# Patient Record
Sex: Male | Born: 1970 | Race: White | Hispanic: No | Marital: Married | Smoking: Former smoker
Health system: Southern US, Community
[De-identification: ages and names within clinical notes are randomized; demographics above are authoritative.]

## PROBLEM LIST (undated history)

## (undated) DIAGNOSIS — F329 Major depressive disorder, single episode, unspecified: Secondary | ICD-10-CM

## (undated) DIAGNOSIS — D759 Disease of blood and blood-forming organs, unspecified: Secondary | ICD-10-CM

## (undated) DIAGNOSIS — F32A Depression, unspecified: Secondary | ICD-10-CM

## (undated) DIAGNOSIS — G4730 Sleep apnea, unspecified: Secondary | ICD-10-CM

## (undated) DIAGNOSIS — Z8719 Personal history of other diseases of the digestive system: Secondary | ICD-10-CM

## (undated) DIAGNOSIS — K219 Gastro-esophageal reflux disease without esophagitis: Secondary | ICD-10-CM

## (undated) DIAGNOSIS — IMO0002 Reserved for concepts with insufficient information to code with codable children: Secondary | ICD-10-CM

## (undated) HISTORY — DX: Depression, unspecified: F32.A

## (undated) HISTORY — PX: FRACTURE SURGERY: SHX138

## (undated) HISTORY — PX: HERNIA REPAIR: SHX51

## (undated) HISTORY — PX: APPENDECTOMY: SHX54

## (undated) HISTORY — DX: Major depressive disorder, single episode, unspecified: F32.9

---

## 2002-01-27 HISTORY — PX: BACK SURGERY: SHX140

## 2006-01-16 ENCOUNTER — Emergency Department (HOSPITAL_COMMUNITY): Admission: EM | Admit: 2006-01-16 | Discharge: 2006-01-16 | Payer: Self-pay | Admitting: Emergency Medicine

## 2006-04-07 ENCOUNTER — Ambulatory Visit: Payer: Self-pay | Admitting: *Deleted

## 2006-04-07 ENCOUNTER — Inpatient Hospital Stay (HOSPITAL_COMMUNITY): Admission: EM | Admit: 2006-04-07 | Discharge: 2006-04-09 | Payer: Self-pay | Admitting: *Deleted

## 2007-05-22 ENCOUNTER — Emergency Department (HOSPITAL_COMMUNITY): Admission: EM | Admit: 2007-05-22 | Discharge: 2007-05-22 | Payer: Self-pay | Admitting: Emergency Medicine

## 2007-05-23 ENCOUNTER — Emergency Department (HOSPITAL_COMMUNITY): Admission: EM | Admit: 2007-05-23 | Discharge: 2007-05-23 | Payer: Self-pay | Admitting: Emergency Medicine
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
## 2011-04-07 LAB — DIFFERENTIAL
Basophils Absolute: 0
Basophils Absolute: 0
Eosinophils Relative: 0
Lymphocytes Relative: 16
Lymphs Abs: 1.2
Monocytes Absolute: 0.7
Monocytes Relative: 6
Monocytes Relative: 7
Neutro Abs: 10.1 — ABNORMAL HIGH
Neutro Abs: 10.4 — ABNORMAL HIGH
Neutrophils Relative %: 84 — ABNORMAL HIGH

## 2011-04-07 LAB — BASIC METABOLIC PANEL
BUN: 12
CO2: 27
Calcium: 9.6
Chloride: 106
Creatinine, Ser: 1.09
GFR calc Af Amer: >60
GFR calc non Af Amer: >60

## 2011-04-07 LAB — URINALYSIS, ROUTINE W REFLEX MICROSCOPIC
Bilirubin Urine: NEGATIVE
Glucose, UA: NEGATIVE
Ketones, ur: NEGATIVE
Nitrite: NEGATIVE
Protein, ur: NEGATIVE
Specific Gravity, Urine: 1.025
Urobilinogen, UA: 0.2
pH: 6

## 2011-04-07 LAB — CBC
Platelets: 211
RDW: 12.5

## 2011-04-07 LAB — RAPID URINE DRUG SCREEN, HOSP PERFORMED
Barbiturates: NOT DETECTED
Benzodiazepines: POSITIVE — AB
Cocaine: NOT DETECTED
Opiates: NOT DETECTED

## 2011-04-07 LAB — ETHANOL: Alcohol, Ethyl (B): <5

## 2011-08-23 ENCOUNTER — Encounter (HOSPITAL_BASED_OUTPATIENT_CLINIC_OR_DEPARTMENT_OTHER): Payer: Self-pay | Admitting: Emergency Medicine

## 2011-08-23 ENCOUNTER — Emergency Department (HOSPITAL_BASED_OUTPATIENT_CLINIC_OR_DEPARTMENT_OTHER)
Admission: EM | Admit: 2011-08-23 | Discharge: 2011-08-23 | Disposition: A | Discharge status: Home / Self Care | Payer: Self-pay | Attending: Emergency Medicine | Admitting: Emergency Medicine

## 2011-08-23 ENCOUNTER — Emergency Department (INDEPENDENT_AMBULATORY_CARE_PROVIDER_SITE_OTHER): Payer: Self-pay

## 2011-08-23 ENCOUNTER — Ambulatory Visit (HOSPITAL_BASED_OUTPATIENT_CLINIC_OR_DEPARTMENT_OTHER)
Admission: RE | Admit: 2011-08-23 | Discharge: 2011-08-23 | Disposition: A | Discharge status: Home / Self Care | Payer: Self-pay | Source: Ambulatory Visit | Attending: Nurse Practitioner | Admitting: Nurse Practitioner

## 2011-08-23 DIAGNOSIS — M62838 Other muscle spasm: Secondary | ICD-10-CM | POA: Insufficient documentation

## 2011-08-23 DIAGNOSIS — M5030 Other cervical disc degeneration, unspecified cervical region: Secondary | ICD-10-CM

## 2011-08-23 DIAGNOSIS — IMO0002 Reserved for concepts with insufficient information to code with codable children: Secondary | ICD-10-CM

## 2011-08-23 DIAGNOSIS — M542 Cervicalgia: Secondary | ICD-10-CM | POA: Insufficient documentation

## 2011-08-23 DIAGNOSIS — M5020 Other cervical disc displacement, unspecified cervical region: Secondary | ICD-10-CM

## 2011-08-23 DIAGNOSIS — M47812 Spondylosis without myelopathy or radiculopathy, cervical region: Secondary | ICD-10-CM

## 2011-08-23 HISTORY — DX: Reserved for concepts with insufficient information to code with codable children: IMO0002

## 2011-08-23 MED ORDER — HYDROMORPHONE HCL PF 1 MG/ML IJ SOLN
1.0000 mg | Freq: Once | INTRAMUSCULAR | Status: AC
  Administered 2011-08-23: 1 mg via INTRAMUSCULAR
  Filled 2011-08-23: qty 1

## 2011-08-23 MED ORDER — DIAZEPAM 10 MG PO TABS: 10.0000 mg | ORAL_TABLET | Freq: Two times a day (BID) | ORAL | Status: AC

## 2011-08-23 MED ORDER — CYCLOBENZAPRINE HCL 10 MG PO TABS
10.0000 mg | ORAL_TABLET | Freq: Once | ORAL | Status: AC
  Administered 2011-08-23: 10 mg via ORAL
  Filled 2011-08-23: qty 1

## 2011-08-23 MED ORDER — OXYCODONE-ACETAMINOPHEN 5-325 MG PO TABS: 2.0000 | ORAL_TABLET | ORAL | Status: AC | PRN

## 2011-08-23 NOTE — ED Notes (Signed)
Pt sts he has had a "crick" in his neck for several days, today pain became severe when he was getting up off sofa

## 2011-08-23 NOTE — ED Provider Notes (Signed)
Medical screening examination/treatment/procedure(s) were performed by non-physician practitioner and as supervising physician I was immediately available for consultation/collaboration.  Odysseus Cada, MD 08/23/11 2227 

## 2011-08-23 NOTE — ED Provider Notes (Signed)
History     CSN: [id number]  Arrival date & time 08/23/11  1610   First MD Initiated Contact with Patient 08/23/11 1657      Chief Complaint  Patient presents with  . Neck Pain    (Consider location/radiation/quality/duration/timing/severity/associated sxs/prior treatment) HPI Comments: Pt states that he woke up with the soreness a couple of day ago and then today the pain seem to worsen getting off the couch:pt denies any known injury  Patient is a 41 y.o. male presenting with neck pain. The history is provided by the patient. No language interpreter was used.  Neck Pain  This is a recurrent problem. The current episode started more than 2 days ago. The problem occurs constantly. The problem has been rapidly worsening. The pain is associated with twisting. There has been no fever. The pain is present in the left side. The quality of the pain is described as stabbing. The symptoms are aggravated by twisting. The pain is the same all the time. Pertinent negatives include no numbness, no bowel incontinence, no bladder incontinence and no weakness. He has tried NSAIDs and heat for the symptoms. The treatment provided mild relief.    Past Medical History  Diagnosis Date  . DDD (degenerative disc disease)     Past Surgical History  Procedure Date  . Back surgery     History reviewed. No pertinent family history.  History  Substance Use Topics  . Smoking status: Former Games developer  . Smokeless tobacco: Not on file  . Alcohol Use: Yes     social      Review of Systems  HENT: Positive for neck pain.   Gastrointestinal: Negative for bowel incontinence.  Genitourinary: Negative for bladder incontinence.  Neurological: Negative for weakness and numbness.  All other systems reviewed and are negative.    Allergies  Review of patient's allergies indicates no known allergies.  Home Medications   Current Outpatient Rx  Name Route Sig Dispense Refill  . AMOXICILLIN 875 MG PO  TABS Oral Take 875 mg by mouth 2 (two) times daily. For 10 days starting 08/08/2011    . GOODY HEADACHE PO Oral Take 1 packet by mouth once as needed. For headache    . IBUPROFEN 200 MG PO TABS Oral Take 600 mg by mouth every 6 (six) hours as needed. For pain    . LIDOCAINE VISCOUS 2 % MT SOLN Mouth Rinse 5-10 mLs by Mouth Rinse route every 3 (three) hours as needed. For sore throat    . PROPOXYPHENE N-ACETAMINOPHEN 100-650 MG PO TABS Oral Take 1 tablet by mouth once as needed. For pain    . RANITIDINE HCL 150 MG PO TABS Oral Take 150 mg by mouth daily.      BP 159/110  Pulse 87  Temp(Src) 98 F (36.7 C) (Oral)  Resp 20  Ht 6\' 3"  (1.905 m)  Wt 245 lb (111.131 kg)  BMI 30.62 kg/m2  SpO2 97%  Physical Exam  Nursing note and vitals reviewed. Constitutional: He is oriented to person, place, and time. He appears well-developed and well-nourished.  HENT:  Head: Normocephalic and atraumatic.  Eyes: Conjunctivae and EOM are normal.  Neck: Neck supple.       Pt tender in the left lateral neck:pt unable to move side to side due to pain  Pulmonary/Chest: Effort normal.  Abdominal: Soft. Bowel sounds are normal.  Musculoskeletal:       Pt tender with mild swelling noted in the left lateral neck  Neurological:  He is alert and oriented to person, place, and time. He exhibits normal muscle tone. Coordination normal.       Pt has equal grip strengths  Skin: Skin is warm and dry.  Psychiatric: He has a normal mood and affect.    ED Course  Procedures (including critical care time)  Labs Reviewed - No data to display Ct Cervical Spine Wo Contrast  08/23/2011  *RADIOLOGY REPORT*  Clinical Data: 41 year old male with neck pain.  CT CERVICAL SPINE WITHOUT CONTRAST  Technique:  Multidetector CT imaging of the cervical spine was performed. Multiplanar CT image reconstructions were also generated.  Comparison: 05/23/2007  Findings: Reversal of normal cervical lordosis is identified. There is no  evidence of fracture, subluxation or prevertebral soft tissue swelling.  Mild degenerative disc disease and spondylosis at C5-C6 again noted with broad-based disc bulge which contacts the cord and causes mild to moderate central spinal narrowing.  There is no evidence of bony foraminal narrowing bilaterally. No suspicious bony lesions are identified. The soft tissue structures are unremarkable otherwise.  IMPRESSION: No evidence of acute bony abnormality.  Degenerative disease and broad-based disc bulge at C5-C6 again noted.  The disc bulge contacts the cord and causes mild to moderate central spinal narrowing - correlate with the patient's symptoms.  Original Report Authenticated By: Rosendo Gros, M.D.     1. Muscle spasm   2. Bulging disc       MDM  Pt  Is feeling slightly better with pain medication:pt not having any neuro deficits:pt to have outpt imaging        Teressa Lower, NP 08/23/11 1903

## 2011-08-23 NOTE — ED Notes (Signed)
Patient transported to CT 

## 2011-08-23 NOTE — ED Notes (Signed)
Pt stated that he was still in severe pain, pt states that he has not observed any change by using pain medication, discussed with Deliah Boston, FNP. No orders received.

## 2011-08-24 ENCOUNTER — Other Ambulatory Visit (HOSPITAL_BASED_OUTPATIENT_CLINIC_OR_DEPARTMENT_OTHER): Payer: Self-pay | Admitting: Emergency Medicine

## 2011-08-24 DIAGNOSIS — IMO0002 Reserved for concepts with insufficient information to code with codable children: Secondary | ICD-10-CM

## 2011-08-25 ENCOUNTER — Ambulatory Visit (HOSPITAL_BASED_OUTPATIENT_CLINIC_OR_DEPARTMENT_OTHER)
Admission: RE | Admit: 2011-08-25 | Discharge: 2011-08-25 | Disposition: A | Discharge status: Home / Self Care | Payer: Medicare Other | Source: Ambulatory Visit | Attending: Emergency Medicine | Admitting: Emergency Medicine

## 2011-08-25 DIAGNOSIS — M5090 Cervical disc disorder, unspecified, unspecified cervical region: Secondary | ICD-10-CM | POA: Insufficient documentation

## 2011-08-25 DIAGNOSIS — M5020 Other cervical disc displacement, unspecified cervical region: Secondary | ICD-10-CM

## 2011-08-25 DIAGNOSIS — R209 Unspecified disturbances of skin sensation: Secondary | ICD-10-CM

## 2011-08-25 DIAGNOSIS — M542 Cervicalgia: Secondary | ICD-10-CM

## 2012-06-03 ENCOUNTER — Other Ambulatory Visit: Payer: Self-pay | Admitting: Neurosurgery

## 2012-06-03 DIAGNOSIS — M5410 Radiculopathy, site unspecified: Secondary | ICD-10-CM

## 2012-06-03 DIAGNOSIS — M542 Cervicalgia: Secondary | ICD-10-CM

## 2012-06-09 ENCOUNTER — Ambulatory Visit (HOSPITAL_BASED_OUTPATIENT_CLINIC_OR_DEPARTMENT_OTHER): Payer: Medicare Other | Attending: Physician Assistant

## 2012-06-09 VITALS — Ht 73.0 in | Wt 250.0 lb

## 2012-06-09 DIAGNOSIS — G4733 Obstructive sleep apnea (adult) (pediatric): Secondary | ICD-10-CM | POA: Insufficient documentation

## 2012-06-10 ENCOUNTER — Ambulatory Visit
Admission: RE | Admit: 2012-06-10 | Discharge: 2012-06-10 | Disposition: A | Discharge status: Home / Self Care | Payer: Medicare Other | Source: Ambulatory Visit | Attending: Neurosurgery | Admitting: Neurosurgery

## 2012-06-10 VITALS — BP 112/65 | HR 55

## 2012-06-10 DIAGNOSIS — M5410 Radiculopathy, site unspecified: Secondary | ICD-10-CM

## 2012-06-10 DIAGNOSIS — M542 Cervicalgia: Secondary | ICD-10-CM

## 2012-06-10 MED ORDER — DIAZEPAM 5 MG PO TABS
10.0000 mg | ORAL_TABLET | Freq: Once | ORAL | Status: AC
  Administered 2012-06-10: 10 mg via ORAL

## 2012-06-10 MED ORDER — HYDROCODONE-ACETAMINOPHEN 5-325 MG PO TABS
2.0000 | ORAL_TABLET | Freq: Once | ORAL | Status: AC
  Administered 2012-06-10: 2 via ORAL

## 2012-06-10 MED ORDER — IOHEXOL 300 MG/ML  SOLN
10.0000 mL | Freq: Once | INTRAMUSCULAR | Status: AC | PRN
  Administered 2012-06-10: 10 mL via INTRATHECAL

## 2012-06-11 DIAGNOSIS — R0609 Other forms of dyspnea: Secondary | ICD-10-CM

## 2012-06-11 DIAGNOSIS — R0989 Other specified symptoms and signs involving the circulatory and respiratory systems: Secondary | ICD-10-CM

## 2012-06-11 DIAGNOSIS — G4733 Obstructive sleep apnea (adult) (pediatric): Secondary | ICD-10-CM

## 2012-06-11 NOTE — Procedures (Signed)
NAME:  [name], Tony [name] NO.:  [id number]  MEDICAL RECORD NO.:  [id number]          PATIENT TYPE:  OUT  LOCATION:  SLEEP CENTER                 FACILITY[facility]  [facility]  PHYSICIAN:  Courtney Fenlon D. Maple Hudson, MD, FCCP, FACPDATE OF BIRTH:  1970/09/12  DATE OF STUDY:  06/09/2012                           NOCTURNAL POLYSOMNOGRAM  REFERRING PHYSICIAN:  Helene Kelp, PA-C  INDICATION FOR STUDY:  Hypersomnia with sleep apnea.  EPWORTH SLEEPINESS SCORE:  1/24.  BMI 33, weight 250 pounds, height 71 inches, neck 17 inches.  MEDICATIONS:  Home medications are charted and reviewed.  SLEEP ARCHITECTURE:  Split study protocol.  During the diagnostic phase, total sleep time 128 minutes with sleep efficiency 73.8%.  Stage I was 5.5%, stage II, 84%, stage III absent, REM 10.5% of total sleep time. Sleep latency 12 minutes, REM latency 146.5 minutes, awake after sleep onset 33.5 minutes.  Arousal index 3.3.  BEDTIME MEDICATION:  None.  He had a sustained interval of wakefulness between approximately 2 a.m. and 5 a.m.  RESPIRATORY DATA:  Split study protocol.  Apnea/hypopnea index (AHI) 28.6 per hour.  A total of 63 events was scored including 12 obstructive apneas, 1 central apnea, 48 hypopneas.  Most events were associated with supine sleep position.  REM AHI 97.8 per hour.  CPAP was then titrated to 11 CWP, AHI 0 per hour.  He wore a medium ResMed Quattro FX full-face mask with heated humidifier.  OXYGEN DATA:  Snoring before CPAP with oxygen desaturation to a nadir of 78% on room air.  With CPAP control, snoring was prevented and mean oxygen saturation held 93.1% on room air.  CARDIAC DATA:  Normal sinus rhythm.  MOVEMENT-PARASOMNIA:  No significant movement disturbance.  No bathroom trips.  IMPRESSIONS-RECOMMENDATIONS: 1. Moderate obstructive sleep apnea/hypopnea syndrome, AHI 28.6 per     hour with mostly supine events.  Snoring with oxygen desaturation     to a nadir of 78% on  room air. 2. Successful CPAP titration to 11 CWP, AHI 0 per hour.  He wore a     medium ResMed Quattro FX full-face     mask with heated humidifier.  Snoring was prevented and mean oxygen     saturation held 93.1% on room air with CPAP.     Alphonsine Minium D. Maple Hudson, MD, [facility], FACP Diplomate, American Board of Sleep Medicine    CDY/MEDQ  D:  06/11/2012 13:59:14  T:  06/11/2012 16:10:96  Job:  [id number]

## 2012-06-23 ENCOUNTER — Other Ambulatory Visit: Payer: Self-pay | Admitting: Neurosurgery

## 2012-06-24 ENCOUNTER — Encounter (HOSPITAL_COMMUNITY): Payer: Self-pay | Admitting: Pharmacy Technician

## 2012-06-27 NOTE — Pre-Procedure Instructions (Signed)
[address] Demarlo Riojas [address]under  06/27/2012   Your procedure is scheduled on:  January 7  Report to [facility] at 12:15 PM.  Call this number if you have problems the morning of surgery: [phone]   Remember:   Do not eat or drink:After Midnight.  Take these medicines the morning of surgery with A SIP OF WATER: Neurontin, Hydrocodone, Prilosec, Bring CPAP mask with you   Do not wear jewelry, make-up or nail polish.  Do not wear lotions, powders, or perfumes. You may wear deodorant.  Do not shave 48 hours prior to surgery. Men may shave face and neck.  Do not bring valuables to the hospital.  Contacts, dentures or bridgework may not be worn into surgery.  Leave suitcase in the car. After surgery it may be brought to your room.  For patients admitted to the hospital, checkout time is 11:00 AM the day of discharge.   Patients discharged the day of surgery will not be allowed to drive home.  Name and phone number of your driver: Family/ Friend  Special Instructions: Shower using CHG 2 nights before surgery and the night before surgery.  If you shower the day of surgery use CHG.  Use special wash - you have one bottle of CHG for all showers.  You should use approximately 1/3 of the bottle for each shower.   Please read over the following fact sheets that you were given: Pain Booklet, Coughing and Deep Breathing and Surgical Site Infection Prevention

## 2012-06-28 ENCOUNTER — Encounter (HOSPITAL_COMMUNITY): Payer: Self-pay

## 2012-06-28 ENCOUNTER — Encounter (HOSPITAL_COMMUNITY)
Admission: RE | Admit: 2012-06-28 | Discharge: 2012-06-28 | Disposition: A | Discharge status: Home / Self Care | Payer: Medicare Other | Source: Ambulatory Visit | Attending: Neurosurgery | Admitting: Neurosurgery

## 2012-06-28 HISTORY — DX: Personal history of other diseases of the digestive system: Z87.19

## 2012-06-28 HISTORY — DX: Sleep apnea, unspecified: G47.30

## 2012-06-28 HISTORY — DX: Gastro-esophageal reflux disease without esophagitis: K21.9

## 2012-06-28 LAB — CBC
MCH: 29.9 pg (ref 26.0–34.0)
MCHC: 33.8 g/dL (ref 30.0–36.0)
MCV: 88.4 fL (ref 78.0–100.0)
Platelets: 230 10*3/uL (ref 150–400)
RBC: 5.19 MIL/uL (ref 4.22–5.81)

## 2012-06-28 LAB — BASIC METABOLIC PANEL
BUN: 14 mg/dL (ref 6–23)
Calcium: 9.2 mg/dL (ref 8.4–10.5)
Creatinine, Ser: 1.12 mg/dL (ref 0.50–1.35)
GFR calc Af Amer: >90 mL/min (ref >90)

## 2012-06-28 LAB — SURGICAL PCR SCREEN
MRSA, PCR: NEGATIVE
Staphylococcus aureus: NEGATIVE

## 2012-07-04 MED ORDER — CEFAZOLIN SODIUM-DEXTROSE 2-3 GM-% IV SOLR
2.0000 g | INTRAVENOUS | Status: AC
  Administered 2012-07-05: 2 g via INTRAVENOUS
  Filled 2012-07-04: qty 50

## 2012-07-05 ENCOUNTER — Encounter (HOSPITAL_COMMUNITY): Payer: Self-pay | Admitting: *Deleted

## 2012-07-05 ENCOUNTER — Inpatient Hospital Stay (HOSPITAL_COMMUNITY)
Admission: RE | Admit: 2012-07-05 | Discharge: 2012-07-07 | DRG: 473 | Disposition: A | Discharge status: Home / Self Care | Payer: Medicare Other | Source: Ambulatory Visit | Attending: Neurosurgery | Admitting: Neurosurgery

## 2012-07-05 ENCOUNTER — Ambulatory Visit (HOSPITAL_COMMUNITY): Payer: Medicare Other

## 2012-07-05 ENCOUNTER — Encounter (HOSPITAL_COMMUNITY): Payer: Self-pay | Admitting: Anesthesiology

## 2012-07-05 ENCOUNTER — Encounter (HOSPITAL_COMMUNITY)
Admission: RE | Disposition: A | Discharge status: Home / Self Care | Payer: Self-pay | Source: Ambulatory Visit | Attending: Neurosurgery

## 2012-07-05 ENCOUNTER — Ambulatory Visit (HOSPITAL_COMMUNITY): Payer: Medicare Other | Admitting: Anesthesiology

## 2012-07-05 DIAGNOSIS — M4802 Spinal stenosis, cervical region: Principal | ICD-10-CM | POA: Diagnosis present

## 2012-07-05 HISTORY — DX: Disease of blood and blood-forming organs, unspecified: D75.9

## 2012-07-05 HISTORY — PX: ANTERIOR CERVICAL DECOMP/DISCECTOMY FUSION: SHX1161

## 2012-07-05 SURGERY — ANTERIOR CERVICAL DECOMPRESSION/DISCECTOMY FUSION 2 LEVELS
Anesthesia: General | Site: Neck | Wound class: Clean

## 2012-07-05 MED ORDER — DIAZEPAM 5 MG PO TABS
5.0000 mg | ORAL_TABLET | Freq: Four times a day (QID) | ORAL | Status: DC | PRN
  Administered 2012-07-06 (×2): 5 mg via ORAL
  Filled 2012-07-05 (×2): qty 1

## 2012-07-05 MED ORDER — VECURONIUM BROMIDE 10 MG IV SOLR
INTRAVENOUS | Status: DC | PRN
  Administered 2012-07-05: 3 mg via INTRAVENOUS

## 2012-07-05 MED ORDER — DEXAMETHASONE SODIUM PHOSPHATE 4 MG/ML IJ SOLN
4.0000 mg | Freq: Four times a day (QID) | INTRAMUSCULAR | Status: DC
  Filled 2012-07-05 (×8): qty 1

## 2012-07-05 MED ORDER — GLYCOPYRROLATE 0.2 MG/ML IJ SOLN
INTRAMUSCULAR | Status: DC | PRN
  Administered 2012-07-05: 0.4 mg via INTRAVENOUS

## 2012-07-05 MED ORDER — SODIUM CHLORIDE 0.9 % IJ SOLN
3.0000 mL | Freq: Two times a day (BID) | INTRAMUSCULAR | Status: DC
  Administered 2012-07-07: 3 mL via INTRAVENOUS

## 2012-07-05 MED ORDER — HYDROMORPHONE HCL PF 1 MG/ML IJ SOLN
INTRAMUSCULAR | Status: AC
  Filled 2012-07-05: qty 1

## 2012-07-05 MED ORDER — SODIUM CHLORIDE 0.9 % IV SOLN
INTRAVENOUS | Status: DC
  Administered 2012-07-05 – 2012-07-06 (×2): via INTRAVENOUS

## 2012-07-05 MED ORDER — MORPHINE SULFATE 2 MG/ML IJ SOLN
1.0000 mg | INTRAMUSCULAR | Status: DC | PRN
  Administered 2012-07-05: 2 mg via INTRAVENOUS

## 2012-07-05 MED ORDER — CEFAZOLIN SODIUM 1-5 GM-% IV SOLN
1.0000 g | Freq: Three times a day (TID) | INTRAVENOUS | Status: AC
  Administered 2012-07-05 – 2012-07-06 (×2): 1 g via INTRAVENOUS
  Filled 2012-07-05 (×2): qty 50

## 2012-07-05 MED ORDER — SODIUM CHLORIDE 0.9 % IV SOLN: 250.0000 mL | INTRAVENOUS | Status: DC

## 2012-07-05 MED ORDER — ROCURONIUM BROMIDE 100 MG/10ML IV SOLN
INTRAVENOUS | Status: DC | PRN
  Administered 2012-07-05: 50 mg via INTRAVENOUS

## 2012-07-05 MED ORDER — ONDANSETRON HCL 4 MG/2ML IJ SOLN: 4.0000 mg | INTRAMUSCULAR | Status: DC | PRN

## 2012-07-05 MED ORDER — MORPHINE SULFATE (PF) 1 MG/ML IV SOLN
INTRAVENOUS | Status: DC
  Administered 2012-07-05: 25 mL via INTRAVENOUS
  Administered 2012-07-06: 1.5 mg via INTRAVENOUS
  Administered 2012-07-06: 25 mg via INTRAVENOUS
  Administered 2012-07-06: 19.5 mg via INTRAVENOUS
  Administered 2012-07-07: 01:00:00 via INTRAVENOUS
  Administered 2012-07-07: 21 mg via INTRAVENOUS
  Filled 2012-07-05 (×3): qty 25

## 2012-07-05 MED ORDER — ACETAMINOPHEN 650 MG RE SUPP: 650.0000 mg | RECTAL | Status: DC | PRN

## 2012-07-05 MED ORDER — NALOXONE HCL 0.4 MG/ML IJ SOLN: 0.4000 mg | INTRAMUSCULAR | Status: DC | PRN

## 2012-07-05 MED ORDER — MIDAZOLAM HCL 5 MG/5ML IJ SOLN
INTRAMUSCULAR | Status: DC | PRN
  Administered 2012-07-05: 2 mg via INTRAVENOUS

## 2012-07-05 MED ORDER — SODIUM CHLORIDE 0.9 % IJ SOLN: 9.0000 mL | INTRAMUSCULAR | Status: DC | PRN

## 2012-07-05 MED ORDER — MENTHOL 3 MG MT LOZG
1.0000 | LOZENGE | OROMUCOSAL | Status: DC | PRN
  Filled 2012-07-05: qty 9

## 2012-07-05 MED ORDER — DIAZEPAM 5 MG PO TABS
ORAL_TABLET | ORAL | Status: AC
  Filled 2012-07-05: qty 1

## 2012-07-05 MED ORDER — SODIUM CHLORIDE 0.9 % IJ SOLN: 3.0000 mL | INTRAMUSCULAR | Status: DC | PRN

## 2012-07-05 MED ORDER — 0.9 % SODIUM CHLORIDE (POUR BTL) OPTIME
TOPICAL | Status: DC | PRN
  Administered 2012-07-05: 1000 mL

## 2012-07-05 MED ORDER — NEOSTIGMINE METHYLSULFATE 1 MG/ML IJ SOLN
INTRAMUSCULAR | Status: DC | PRN
  Administered 2012-07-05: 3 mg via INTRAVENOUS

## 2012-07-05 MED ORDER — PHENOL 1.4 % MT LIQD
1.0000 | OROMUCOSAL | Status: DC | PRN
  Filled 2012-07-05: qty 177

## 2012-07-05 MED ORDER — DIPHENHYDRAMINE HCL 50 MG/ML IJ SOLN: 12.5000 mg | Freq: Four times a day (QID) | INTRAMUSCULAR | Status: DC | PRN

## 2012-07-05 MED ORDER — DEXAMETHASONE SODIUM PHOSPHATE 4 MG/ML IJ SOLN
INTRAMUSCULAR | Status: DC | PRN
  Administered 2012-07-05: 4 mg via INTRAVENOUS

## 2012-07-05 MED ORDER — DEXAMETHASONE 4 MG PO TABS
4.0000 mg | ORAL_TABLET | Freq: Four times a day (QID) | ORAL | Status: DC
  Administered 2012-07-05 – 2012-07-07 (×6): 4 mg via ORAL
  Filled 2012-07-05 (×10): qty 1

## 2012-07-05 MED ORDER — INFLUENZA VIRUS VACC SPLIT PF IM SUSP
0.5000 mL | INTRAMUSCULAR | Status: AC | PRN
  Administered 2012-07-07: 0.5 mL via INTRAMUSCULAR
  Filled 2012-07-05: qty 0.5

## 2012-07-05 MED ORDER — LIDOCAINE HCL (CARDIAC) 20 MG/ML IV SOLN
INTRAVENOUS | Status: DC | PRN
  Administered 2012-07-05: 100 mg via INTRAVENOUS

## 2012-07-05 MED ORDER — PROPOFOL 10 MG/ML IV BOLUS
INTRAVENOUS | Status: DC | PRN
  Administered 2012-07-05: 200 mg via INTRAVENOUS

## 2012-07-05 MED ORDER — MORPHINE SULFATE 2 MG/ML IJ SOLN
INTRAMUSCULAR | Status: AC
  Filled 2012-07-05: qty 1

## 2012-07-05 MED ORDER — METOCLOPRAMIDE HCL 5 MG/ML IJ SOLN: 10.0000 mg | Freq: Once | INTRAMUSCULAR | Status: DC | PRN

## 2012-07-05 MED ORDER — HEMOSTATIC AGENTS (NO CHARGE) OPTIME
TOPICAL | Status: DC | PRN
  Administered 2012-07-05 (×2): 1 via TOPICAL

## 2012-07-05 MED ORDER — GABAPENTIN 100 MG PO CAPS
100.0000 mg | ORAL_CAPSULE | Freq: Two times a day (BID) | ORAL | Status: DC
  Administered 2012-07-05: 200 mg via ORAL
  Administered 2012-07-06: 100 mg via ORAL
  Administered 2012-07-06 – 2012-07-07 (×2): 200 mg via ORAL
  Filled 2012-07-05 (×6): qty 2

## 2012-07-05 MED ORDER — OXYCODONE-ACETAMINOPHEN 5-325 MG PO TABS
ORAL_TABLET | ORAL | Status: AC
  Administered 2012-07-05: 2 via ORAL
  Filled 2012-07-05: qty 2

## 2012-07-05 MED ORDER — ONDANSETRON HCL 4 MG/2ML IJ SOLN
INTRAMUSCULAR | Status: DC | PRN
  Administered 2012-07-05: 4 mg via INTRAVENOUS

## 2012-07-05 MED ORDER — THROMBIN 5000 UNITS EX SOLR
CUTANEOUS | Status: DC | PRN
  Administered 2012-07-05 (×3): 5000 [IU] via TOPICAL

## 2012-07-05 MED ORDER — HYDROMORPHONE HCL PF 1 MG/ML IJ SOLN
0.2500 mg | INTRAMUSCULAR | Status: DC | PRN
  Administered 2012-07-05 (×4): 0.5 mg via INTRAVENOUS

## 2012-07-05 MED ORDER — ACETAMINOPHEN 325 MG PO TABS: 650.0000 mg | ORAL_TABLET | ORAL | Status: DC | PRN

## 2012-07-05 MED ORDER — ONDANSETRON HCL 4 MG/2ML IJ SOLN: 4.0000 mg | Freq: Four times a day (QID) | INTRAMUSCULAR | Status: DC | PRN

## 2012-07-05 MED ORDER — SUFENTANIL CITRATE 50 MCG/ML IV SOLN
INTRAVENOUS | Status: DC | PRN
  Administered 2012-07-05: 10 ug via INTRAVENOUS
  Administered 2012-07-05: 20 ug via INTRAVENOUS

## 2012-07-05 MED ORDER — PNEUMOCOCCAL VAC POLYVALENT 25 MCG/0.5ML IJ INJ
0.5000 mL | INJECTION | INTRAMUSCULAR | Status: AC | PRN
  Administered 2012-07-07: 0.5 mL via INTRAMUSCULAR
  Filled 2012-07-05: qty 0.5

## 2012-07-05 MED ORDER — OXYCODONE HCL 5 MG PO TABS: 5.0000 mg | ORAL_TABLET | Freq: Once | ORAL | Status: DC | PRN

## 2012-07-05 MED ORDER — ZOLPIDEM TARTRATE 5 MG PO TABS
10.0000 mg | ORAL_TABLET | Freq: Every evening | ORAL | Status: DC | PRN
  Administered 2012-07-06: 10 mg via ORAL
  Filled 2012-07-05: qty 2

## 2012-07-05 MED ORDER — LACTATED RINGERS IV SOLN
INTRAVENOUS | Status: DC | PRN
  Administered 2012-07-05 (×2): via INTRAVENOUS

## 2012-07-05 MED ORDER — DIPHENHYDRAMINE HCL 12.5 MG/5ML PO ELIX: 12.5000 mg | ORAL_SOLUTION | Freq: Four times a day (QID) | ORAL | Status: DC | PRN

## 2012-07-05 MED ORDER — OXYCODONE-ACETAMINOPHEN 5-325 MG PO TABS
1.0000 | ORAL_TABLET | ORAL | Status: DC | PRN
  Administered 2012-07-05 – 2012-07-07 (×4): 2 via ORAL
  Filled 2012-07-05 (×3): qty 2

## 2012-07-05 MED ORDER — OXYCODONE HCL 5 MG/5ML PO SOLN: 5.0000 mg | Freq: Once | ORAL | Status: DC | PRN

## 2012-07-05 SURGICAL SUPPLY — 55 items
APL SKNCLS STERI-STRIP NONHPOA (GAUZE/BANDAGES/DRESSINGS) ×1
BANDAGE GAUZE ELAST BULKY 4 IN (GAUZE/BANDAGES/DRESSINGS) ×4 IMPLANT
BENZOIN TINCTURE PRP APPL 2/3 (GAUZE/BANDAGES/DRESSINGS) ×2 IMPLANT
BIT DRILL SM SPINE QC 14 (BIT) ×1 IMPLANT
BLADE ULTRA TIP 2M (BLADE) ×2 IMPLANT
BUR BARREL STRAIGHT FLUTE 4.0 (BURR) IMPLANT
BUR MATCHSTICK NEURO 3.0 LAGG (BURR) ×2 IMPLANT
CANISTER SUCTION 2500CC (MISCELLANEOUS) ×2 IMPLANT
CLOTH BEACON ORANGE TIMEOUT ST (SAFETY) ×2 IMPLANT
CONT SPEC 4OZ CLIKSEAL STRL BL (MISCELLANEOUS) ×2 IMPLANT
COVER MAYO STAND STRL (DRAPES) ×2 IMPLANT
DRAIN JACKSON PRATT 10MM FLAT (MISCELLANEOUS) ×1 IMPLANT
DRAPE C-ARM 42X72 X-RAY (DRAPES) ×4 IMPLANT
DRAPE LAPAROTOMY 100X72 PEDS (DRAPES) ×2 IMPLANT
DRAPE MICROSCOPE LEICA (MISCELLANEOUS) ×2 IMPLANT
DRAPE POUCH INSTRU U-SHP 10X18 (DRAPES) ×2 IMPLANT
DURAPREP 6ML APPLICATOR 50/CS (WOUND CARE) ×2 IMPLANT
ELECT REM PT RETURN 9FT ADLT (ELECTROSURGICAL) ×2
ELECTRODE REM PT RTRN 9FT ADLT (ELECTROSURGICAL) ×1 IMPLANT
EVACUATOR SILICONE 100CC (DRAIN) ×1 IMPLANT
GAUZE SPONGE 4X4 16PLY XRAY LF (GAUZE/BANDAGES/DRESSINGS) IMPLANT
GLOVE BIOGEL M 8.0 STRL (GLOVE) ×2 IMPLANT
GLOVE BIOGEL PI IND STRL 7.5 (GLOVE) IMPLANT
GLOVE BIOGEL PI INDICATOR 7.5 (GLOVE) ×3
GLOVE ECLIPSE 7.5 STRL STRAW (GLOVE) ×4 IMPLANT
GLOVE EXAM NITRILE LRG STRL (GLOVE) IMPLANT
GLOVE EXAM NITRILE MD LF STRL (GLOVE) IMPLANT
GLOVE EXAM NITRILE XL STR (GLOVE) IMPLANT
GLOVE EXAM NITRILE XS STR PU (GLOVE) IMPLANT
GOWN BRE IMP SLV AUR LG STRL (GOWN DISPOSABLE) ×6 IMPLANT
GOWN BRE IMP SLV AUR XL STRL (GOWN DISPOSABLE) ×3 IMPLANT
GOWN STRL REIN 2XL LVL4 (GOWN DISPOSABLE) IMPLANT
HEAD HALTER (SOFTGOODS) ×2 IMPLANT
HEMOSTAT POWDER KIT SURGIFOAM (HEMOSTASIS) IMPLANT
KIT BASIN OR (CUSTOM PROCEDURE TRAY) ×2 IMPLANT
KIT ROOM TURNOVER OR (KITS) ×2 IMPLANT
NEEDLE SPNL 22GX3.5 QUINCKE BK (NEEDLE) ×2 IMPLANT
NS IRRIG 1000ML POUR BTL (IV SOLUTION) ×2 IMPLANT
PACK LAMINECTOMY NEURO (CUSTOM PROCEDURE TRAY) ×2 IMPLANT
PATTIES SURGICAL .5 X1 (DISPOSABLE) ×2 IMPLANT
PLATE ANT CERV XTEND 2 LV 36 (Plate) ×1 IMPLANT
PUTTY BONE GRAFT KIT 2.5ML (Bone Implant) ×2 IMPLANT
RUBBERBAND STERILE (MISCELLANEOUS) ×4 IMPLANT
SCREW XTD VAR 4.2 SELF TAP (Screw) ×6 IMPLANT
SPACER ACDF SM LORDOTIC 7 (Spacer) ×2 IMPLANT
SPONGE GAUZE 4X4 12PLY (GAUZE/BANDAGES/DRESSINGS) ×2 IMPLANT
SPONGE INTESTINAL PEANUT (DISPOSABLE) ×2 IMPLANT
SPONGE SURGIFOAM ABS GEL SZ50 (HEMOSTASIS) ×2 IMPLANT
STRIP CLOSURE SKIN 1/2X4 (GAUZE/BANDAGES/DRESSINGS) ×2 IMPLANT
SUT VIC AB 3-0 SH 8-18 (SUTURE) ×2 IMPLANT
SYR 20ML ECCENTRIC (SYRINGE) ×2 IMPLANT
TAPE CLOTH SURG 4X10 WHT LF (GAUZE/BANDAGES/DRESSINGS) ×1 IMPLANT
TOWEL OR 17X24 6PK STRL BLUE (TOWEL DISPOSABLE) ×2 IMPLANT
TOWEL OR 17X26 10 PK STRL BLUE (TOWEL DISPOSABLE) ×2 IMPLANT
WATER STERILE IRR 1000ML POUR (IV SOLUTION) ×2 IMPLANT

## 2012-07-05 NOTE — Progress Notes (Signed)
Orthopedic Tech Progress Note Patient Details:  Tony [name] 08-24-1970 [id number]  Ortho Devices Type of Ortho Device: Soft collar Ortho Device/Splint Location: neck Ortho Device/Splint Interventions: Application   Jennye Moccasin 07/05/2012, 8:37 PM

## 2012-07-05 NOTE — Transfer of Care (Signed)
Immediate Anesthesia Transfer of Care Note  Patient: Tony [name]  Procedure(s) Performed: Procedure(s) (LRB) with comments: ANTERIOR CERVICAL DECOMPRESSION/DISCECTOMY FUSION 2 LEVELS (N/A) - Anterior Cervical Four-Five/Five-Six Decompression/Diskectomy and Fusion   Patient Location: PACU  Anesthesia Type:General  Level of Consciousness: sedated, patient cooperative and responds to stimulation  Airway & Oxygen Therapy: Patient Spontanous Breathing and Patient connected to nasal cannula oxygen  Post-op Assessment: Report given to PACU RN, Post -op Vital signs reviewed and stable and Patient moving all extremities X 4  Post vital signs: Reviewed and stable  Complications: No apparent anesthesia complications

## 2012-07-05 NOTE — Anesthesia Postprocedure Evaluation (Signed)
  Anesthesia Post-op Note  Patient: Tony [name]  Procedure(s) Performed: Procedure(s) (LRB) with comments: ANTERIOR CERVICAL DECOMPRESSION/DISCECTOMY FUSION 2 LEVELS (N/A) - Anterior Cervical Four-Five/Five-Six Decompression/Diskectomy and Fusion   Patient Location: PACU  Anesthesia Type:General  Level of Consciousness: awake  Airway and Oxygen Therapy: Patient Spontanous Breathing  Post-op Pain: mild  Post-op Assessment: Post-op Vital signs reviewed  Post-op Vital Signs: stable  Complications: No apparent anesthesia complications

## 2012-07-05 NOTE — Anesthesia Preprocedure Evaluation (Signed)
Anesthesia Evaluation  Patient identified by MRN, date of birth, ID band Patient awake    Reviewed: Allergy & Precautions, H&P , NPO status , Patient's Chart, lab work & pertinent test results, reviewed documented beta blocker date and time   Airway Mallampati: II TM Distance: >3 FB Neck ROM: full    Dental   Pulmonary sleep apnea ,  breath sounds clear to auscultation        Cardiovascular negative cardio ROS  Rhythm:regular     Neuro/Psych negative neurological ROS  negative psych ROS   GI/Hepatic negative GI ROS, Neg liver ROS, hiatal hernia, GERD-  Medicated and Controlled,  Endo/Other  negative endocrine ROS  Renal/GU negative Renal ROS  negative genitourinary   Musculoskeletal   Abdominal   Peds  Hematology negative hematology ROS (+)   Anesthesia Other Findings See surgeon's H&P   Reproductive/Obstetrics negative OB ROS                           Anesthesia Physical Anesthesia Plan  ASA: II  Anesthesia Plan: General   Post-op Pain Management:    Induction: Intravenous  Airway Management Planned: Oral ETT and Video Laryngoscope Planned  Additional Equipment:   Intra-op Plan:   Post-operative Plan: Extubation in OR  Informed Consent: I have reviewed the patients History and Physical, chart, labs and discussed the procedure including the risks, benefits and alternatives for the proposed anesthesia with the patient or authorized representative who has indicated his/her understanding and acceptance.   Dental Advisory Given  Plan Discussed with: CRNA and Surgeon  Anesthesia Plan Comments:         Anesthesia Quick Evaluation

## 2012-07-05 NOTE — Progress Notes (Signed)
Op note [phone]
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
## 2012-07-06 ENCOUNTER — Encounter (HOSPITAL_COMMUNITY): Payer: Self-pay | Admitting: Neurosurgery

## 2012-07-06 NOTE — Evaluation (Signed)
Physical Therapy Evaluation Patient Details Name: Tony [name] MRN: [id number] DOB: 27-Oct-1970 Today's Date: 07/06/2012 Time: 4098-1191 PT Time Calculation (min): 30 min  PT Assessment / Plan / Recommendation Clinical Impression  Pt 42 yo male s/p cervical decompression/fusion mobilizing well. Anticipate patient to be safe for d/c home withspouse when medically ready.    PT Assessment  Patient needs continued PT services    Follow Up Recommendations  No PT follow up;Supervision/Assistance - 24 hour    Does the patient have the potential to tolerate intense rehabilitation      Barriers to Discharge None      Equipment Recommendations  Rolling walker with 5" wheels    Recommendations for Other Services     Frequency Min 5X/week    Precautions / Restrictions Precautions Precautions: Cervical;Fall Precaution Comments: cervical precautions reviewed, handout provided Required Braces or Orthoses: Cervical Brace Restrictions Weight Bearing Restrictions: No   Pain: 7/10 cervical      Mobility  Bed Mobility Bed Mobility: Rolling Right;Right Sidelying to Sit;Sitting - Scoot to Edge of Bed Rolling Right: 5: Supervision Right Sidelying to Sit: 5: Supervision Sitting - Scoot to Edge of Bed: 5: Supervision Details for Bed Mobility Assistance: instructed in log roll technique Transfers Transfers: Sit to Stand;Stand to Sit Sit to Stand: 5: Supervision;With upper extremity assist;From bed Stand to Sit: 5: Supervision;With armrests;To chair/3-in-1 Details for Transfer Assistance: verbal cues for technique and hand placement Ambulation/Gait Ambulation/Gait Assistance: 4: Min guard Ambulation Distance (Feet): 100 Feet Assistive device: None Ambulation/Gait Assistance Details: guarded, slow, no LOB Gait Pattern: Step-through pattern;Decreased stride length;Narrow base of support Gait velocity: slow and guarded General Gait Details: v/c's to relax shoulders Stairs: No      Shoulder Instructions     Exercises     PT Diagnosis: Difficulty walking  PT Problem List: Decreased balance;Decreased mobility PT Treatment Interventions: Gait training;Stair training;Functional mobility training;Therapeutic activities;Therapeutic exercise   PT Goals Acute Rehab PT Goals PT Goal Formulation: With patient Time For Goal Achievement: 07/13/12 Potential to Achieve Goals: Good Pt will Roll Supine to Right Side: with modified independence PT Goal: Rolling Supine to Right Side - Progress: Goal set today Pt will go Supine/Side to Sit: with modified independence;with HOB 0 degrees PT Goal: Supine/Side to Sit - Progress: Goal set today Pt will go Sit to Supine/Side: with modified independence;with HOB 0 degrees PT Goal: Sit to Supine/Side - Progress: Goal set today Pt will go Sit to Stand: with modified independence;with upper extremity assist PT Goal: Sit to Stand - Progress: Goal set today Pt will Ambulate: >150 feet;with modified independence;with least restrictive assistive device PT Goal: Ambulate - Progress: Goal set today Pt will Go Up / Down Stairs: 3-5 stairs;with min assist PT Goal: Up/Down Stairs - Progress: Goal set today Additional Goals Additional Goal #1: Pt independent with recall of 3/3 precautions and 100% compliant. PT Goal: Additional Goal #1 - Progress: Goal set today  Visit Information  Last PT Received On: 07/06/12 Assistance Needed: +1 PT/OT Co-Evaluation/Treatment: Yes    Subjective Data  Subjective: Pt received supine in bed   Prior Functioning  Home Living Lives With: Spouse Available Help at Discharge: Family;Available 24 hours/day Type of Home: House Home Access: Stairs to enter [employer] of Steps: 3 Entrance Stairs-Rails: None Home Layout: One level Bathroom Shower/Tub: Walk-in shower;Door Dentist: None Prior Function Level of Independence: Independent Able to Take  Stairs?: Yes Driving: Yes Vocation: On disability Communication Communication: No difficulties Dominant  Hand: Right    Cognition  Overall Cognitive Status: Appears within functional limits for tasks assessed/performed Arousal/Alertness: Awake/alert Orientation Level: Appears intact for tasks assessed Behavior During Session: [facility] for tasks performed    Extremity/Trunk Assessment Right Upper Extremity Assessment RUE ROM/Strength/Tone: [facility] for tasks assessed;Unable to fully assess;Due to precautions RUE Sensation:  (reports tingling has resolved) RUE Sensation Deficits: residual numbness but better than prior to surgery RUE Coordination: WFL - gross/fine motor Left Upper Extremity Assessment LUE ROM/Strength/Tone: WFL for tasks assessed;Unable to fully assess;Due to precautions LUE Sensation:  (reports improvement in numbness/tingling) LUE Coordination: WFL - gross/fine motor Right Lower Extremity Assessment RLE ROM/Strength/Tone: Within functional levels Left Lower Extremity Assessment LLE ROM/Strength/Tone: Within functional levels Trunk Assessment Trunk Assessment: Normal   Balance    End of Session PT - End of Session Equipment Utilized During Treatment: Gait belt;Cervical collar Activity Tolerance: Patient tolerated treatment well Patient left: in chair;with call bell/phone within reach;with family/visitor present Nurse Communication: Mobility status  GP     Marcene Brawn 07/06/2012, 1:12 PM  Lewis Shock, PT, DPT Pager #: [phone] Office #: [phone]

## 2012-07-06 NOTE — Op Note (Signed)
NAME:  WISE, FEES NO.:  [id number]  MEDICAL RECORD NO.:  [id number]  LOCATION:  [id number]                        FACILITY:  [facility]  PHYSICIAN:  Hilda Lias, M.D.   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  C4-C5 central herniated disk with stenosis.  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  C4-C5 central herniated disk with stenosis.  PROCEDURES:  Anterior 4-5 and 5-6 diskectomy, decompression of the spinal cord, foraminotomy, interbody fusion with cage and plate. Microscope.  SURGEON:  Hilda Lias, M.D.  ASSISTANT:  Clydene Fake, M.D.  CLINICAL HISTORY:  Mr. Tsosie is a gentleman who had been complaining of neck pain radiation to both upper extremities for many years.  This gentleman about 10 years ago had thoracolumbar fusion because of fracture.  He is complaining now of neck pain with weakness of the biceps and tingling in both hands.  He has nerve conduction test, which was negative.  MRI showed central disk at the level of 4-5 and has stenosis at the level of 5-6.  Surgery was advised.  The risks were fully explained to him including the benefit.  PROCEDURE:  The patient was taken to the OR, and after intubation, the left side of the neck was cleaned with DuraPrep.  Drapes were applied. Transverse incision was made through the skin, subcutaneous tissue, platysma, straight down to the cervical spine.  Lateral cervical spine x- rays showed that we were at the level of L5-6.  From then on, anterior osteophyte at the level of 5-6 and 4-5 were removed.  We started at the level of 5-6 with removal of degenerative disk.  The patient had quite a bit of degenerative disk.  The disk was removed using the drill as well as the microcurette.  We opened up posterior ligament and using the  1 and 2-mm Kerrison punch, we were able to decompress the spinal cord as well as both C6 nerve root.  At the level of 4-5, we found again the same degenerative disk disease.  The posterior ligament was opened and foraminotomy to decompress both C5 nerve root was accomplished.  From then on, the endplate were drilled.  Two cages of 7-mm height with autograft and bone extensor were inserted.  This was followed with plate using 6 screws.  Lateral cervical spine x-rays showed good position of the plate, screws and cages.  The area was irrigated.  A drain was left in the precervical area.  The wound was closed with Vicryl and Steri- Strips.  The patient will go to PACU.         ______________________________ Hilda Lias, M.D.    EB/MEDQ  D:  07/05/2012  T:  07/06/2012  Job:  [id number]

## 2012-07-06 NOTE — Progress Notes (Signed)
Patient ID: Tony [name], male   DOB: Dec 14, 1970, 42 y.o.   MRN: [id number] C/o pain in shoulders and some tingling in both hands, no weakness. Drain working well. I/o cath prn. Continue pca morphine

## 2012-07-06 NOTE — Evaluation (Signed)
Occupational Therapy Evaluation Patient Details Name: Tony [name] MRN: [id number] DOB: 27-Apr-1971 Today's Date: 07/06/2012 Time: 4098-1191 OT Time Calculation (min): 30 min  OT Assessment / Plan / Recommendation Clinical Impression  Pt is recovering from ACDF 4,5,6.  He has a long history of back problems and pain.  Pt is doing well post op day one.  Will follow to instruct further in cervical precautions and use of AE for ADL.    OT Assessment  Patient needs continued OT Services    Follow Up Recommendations  No OT follow up;Supervision - Intermittent    Barriers to Discharge      Equipment Recommendations  None recommended by OT    Recommendations for Other Services    Frequency  Min 2X/week    Precautions / Restrictions Precautions Precautions: Cervical;Fall Precaution Comments: cervical precautions reviewed, handout provided Required Braces or Orthoses: Cervical Brace Cervical Brace: Soft collar Restrictions Weight Bearing Restrictions: No   Pertinent Vitals/Pain 4/10 neck, has PCA, repositioned    ADL  Eating/Feeding: Set up Where Assessed - Eating/Feeding: Chair Grooming: Min guard;Wash/dry hands;Wash/dry face Where Assessed - Grooming: Unsupported standing Upper Body Bathing: Minimal assistance Where Assessed - Upper Body Bathing: Unsupported sitting Lower Body Bathing: Maximal assistance Where Assessed - Lower Body Bathing: Unsupported sitting;Supported sit to stand Upper Body Dressing: Minimal assistance Where Assessed - Upper Body Dressing: Unsupported sitting Lower Body Dressing: Maximal assistance Where Assessed - Lower Body Dressing: Unsupported sitting;Supported sit to stand Equipment Used: Gait belt Transfers/Ambulation Related to ADLs: min guard assist and verbal cues for technique and hand placement ADL Comments: Pt formerly put his foot on top of his bed to donn socks.  Can rely on family, may have reacher and has a long shoe horn at home.      OT Diagnosis: Generalized weakness;Acute pain  OT Problem List: Decreased activity tolerance;Impaired balance (sitting and/or standing);Decreased knowledge of use of DME or AE;Decreased knowledge of precautions;Pain OT Treatment Interventions: Self-care/ADL training;DME and/or AE instruction;Patient/family education   OT Goals Acute Rehab OT Goals OT Goal Formulation: With patient Time For Goal Achievement: 07/13/12 Potential to Achieve Goals: Good ADL Goals Pt Will Perform Grooming: with modified independence;Standing at sink ADL Goal: Grooming - Progress: Goal set today Pt Will Perform Lower Body Dressing: with modified independence;Sit to stand from bed ADL Goal: Lower Body Dressing - Progress: Goal set today Miscellaneous OT Goals Miscellaneous OT Goal #1: Pt will state and generalize cervical precautions in ADL and mobility. OT Goal: Miscellaneous Goal #1 - Progress: Goal set today Miscellaneous OT Goal #2: Pt will be knowledgeable in use and awareness of availability of AE for LB ADL. OT Goal: Miscellaneous Goal #2 - Progress: Goal set today  Visit Information  Last OT Received On: 07/06/12 Assistance Needed: +1 PT/OT Co-Evaluation/Treatment: Yes    Subjective Data  Subjective: "I would like to work, but no one will hire me with my back." Patient Stated Goal: Return home with assist of wife and children.   Prior Functioning     Home Living Lives With: Spouse Available Help at Discharge: Family;Available 24 hours/day Type of Home: House Home Access: Stairs to enter [employer] of Steps: 3 Entrance Stairs-Rails: None Home Layout: One level Bathroom Shower/Tub: Walk-in shower;Door Dentist: None Prior Function Level of Independence: Independent Able to Take Stairs?: Yes Driving: Yes Vocation: On disability Comments: likes to hunt Communication Communication: No difficulties Dominant Hand: Right  Vision/Perception     Cognition  Overall Cognitive Status: Appears within functional limits for tasks assessed/performed Arousal/Alertness: Awake/alert Orientation Level: Appears intact for tasks assessed Behavior During Session: [facility] for tasks performed    Extremity/Trunk Assessment Right Upper Extremity Assessment RUE ROM/Strength/Tone: [facility] for tasks assessed;Unable to fully assess;Due to precautions RUE Sensation:  (reports tingling has resolved) RUE Sensation Deficits: residual numbness but better than prior to surgery RUE Coordination: WFL - gross/fine motor Left Upper Extremity Assessment LUE ROM/Strength/Tone: WFL for tasks assessed;Unable to fully assess;Due to precautions LUE Sensation:  (reports improvement in numbness/tingling) LUE Coordination: WFL - gross/fine motor Right Lower Extremity Assessment RLE ROM/Strength/Tone: Within functional levels Left Lower Extremity Assessment LLE ROM/Strength/Tone: Within functional levels Trunk Assessment Trunk Assessment: Normal     Mobility Bed Mobility Bed Mobility: Rolling Right;Right Sidelying to Sit;Sitting - Scoot to Edge of Bed Rolling Right: 5: Supervision Right Sidelying to Sit: 5: Supervision Sitting - Scoot to Edge of Bed: 5: Supervision Details for Bed Mobility Assistance: instructed in log roll technique Transfers Transfers: Sit to Stand;Stand to Sit Sit to Stand: 5: Supervision;With upper extremity assist;From bed Stand to Sit: 5: Supervision;With armrests;To chair/3-in-1 Details for Transfer Assistance: verbal cues for technique and hand placement     Shoulder Instructions     Exercise     Balance     End of Session OT - End of Session Equipment Utilized During Treatment: Gait belt Activity Tolerance: Patient limited by pain Patient left: in chair;with call bell/phone within reach;with family/visitor present Nurse Communication: Mobility status  GO     Evern Bio 07/06/2012, 11:41  AM [phone]

## 2012-07-06 NOTE — Clinical Social Work Note (Signed)
Clinical Social Work  CSW received consult for SNF. CSW reviewed chart and discussed pt with RN during progression. Awaiting PT/OT evals for discharge recommendations. CSW will assess for SNF, if appropriate. Please call with any urgent need. CSW will continue to follow.   Dede Query, MSW, Theresia Majors [phone]

## 2012-07-06 NOTE — Progress Notes (Signed)
UR COMPLETED  

## 2012-07-06 NOTE — Progress Notes (Signed)
Patient complaining of discomfort in bladder area. Pt has voided about 3 times today. Bladder scan performed and showed . Pt given heat pack to place on bladder. Pt spontaneously voided of urine. PVR scan showed 392 mL. Patient states he no longer has any discomfort. Liquids encouraged. Will continue to monitor for signs of urinary retention.

## 2012-07-07 NOTE — Progress Notes (Signed)
Occupational Therapy Treatment and Discharge Patient Details Name: Tony [name] MRN: [id number] DOB: [birth year] Today's Date: 07/07/2012 Time: 4098-1191 OT Time Calculation (min): 44 min  OT Assessment / Plan / Recommendation Comments on Treatment Session Pt has been instructed in cervical precautions and ADL.  Moving well.  No further OT needs.  Will have assist of family at home as needed.    Follow Up Recommendations  No OT follow up;Supervision - Intermittent    Barriers to Discharge       Equipment Recommendations  None recommended by OT    Recommendations for Other Services    Frequency     Plan Discharge plan remains appropriate    Precautions / Restrictions Precautions Precautions: Cervical;Fall Precaution Comments: pt able to state 2/3 precautions, reeducated Required Braces or Orthoses: Cervical Brace Cervical Brace: Soft collar   Pertinent Vitals/Pain 4/10, shoulders, PCA, instructed to decrease guarding in shoulders    ADL  Grooming: Wash/dry hands;Modified independent Where Assessed - Grooming: Unsupported standing Lower Body Bathing: Modified independent (instructed in use of long sponge) Where Assessed - Lower Body Bathing: Unsupported sitting;Supported sit to stand Lower Body Dressing: Modified independent (instructed in use of long shoe horn, sock aide and reacher) Where Assessed - Lower Body Dressing: Unsupported sitting;Supported sit to stand Toilet Transfer: Modified independent Toilet Transfer Method:  (standing) Toileting - Clothing Manipulation and Hygiene: Independent Transfers/Ambulation Related to ADLs: mod I holding IV pole    OT Diagnosis:    OT Problem List:   OT Treatment Interventions:     OT Goals ADL Goals Pt Will Perform Grooming: with modified independence;Standing at sink ADL Goal: Grooming - Progress: Met Pt Will Perform Lower Body Dressing: with modified independence;Sit to stand from bed ADL Goal: Lower Body Dressing -  Progress: Met Miscellaneous OT Goals Miscellaneous OT Goal #1: Pt will state and generalize cervical precautions in ADL and mobility. OT Goal: Miscellaneous Goal #1 - Progress: Partly met Miscellaneous OT Goal #2: Pt will be knowledgeable in use and awareness of availability of AE for LB ADL. OT Goal: Miscellaneous Goal #2 - Progress: Met  Visit Information  Last OT Received On: 07/07/12 Assistance Needed: +1    Subjective Data      Prior Functioning       Cognition  Overall Cognitive Status: Appears within functional limits for tasks assessed/performed Arousal/Alertness: Awake/alert Orientation Level: Appears intact for tasks assessed Behavior During Session: [facility] for tasks performed    Mobility  Shoulder Instructions Bed Mobility Bed Mobility: Not assessed Transfers Transfers: Sit to Stand;Stand to Sit Sit to Stand: 6: Modified independent (Device/Increase time);With upper extremity assist;From bed Stand to Sit: 6: Modified independent (Device/Increase time);With upper extremity assist;To chair/3-in-1       Exercises      Balance     End of Session OT - End of Session Activity Tolerance: Patient tolerated treatment well Patient left: in chair;with call bell/phone within reach;with family/visitor present  GO     Evern Bio 07/07/2012, 9:30 AM [phone]

## 2012-07-07 NOTE — Care Management Note (Signed)
    Page 1 of 1   07/07/2012     2:29:20 PM   CARE MANAGEMENT NOTE 07/07/2012  Patient:  Tony [name], Tony [name]   Account Number:  [id number]  Date Initiated:  07/07/2012  Documentation initiated by:  [facility]  Subjective/Objective Assessment:   Admitted postop C4-6 ACDF     Action/Plan:   Pt/OT evals-recommended rolling walker, no follow recommended   Anticipated DC Date:  07/07/2012   Anticipated DC Plan:  HOME/SELF CARE      DC Planning Services  CM consult      Choice offered to / List presented to:     DME arranged  Levan Hurst      DME agency  [facility]        Status of service:  Completed, signed off Medicare Important Message given?   (If response is "NO", the following Medicare IM given date fields will be blank) Date Medicare IM given:   Date Additional Medicare IM given:    Discharge Disposition:  HOME/SELF CARE  Per UR Regulation:  Reviewed for med. necessity/level of care/duration of stay  If discussed at Long Length of Stay Meetings, dates discussed:    Comments:  07/07/12 Rolling walker recommended by PT. Spoke with patient and his wife about rolling walker, they decided that they wanted the rolling walker .Walker delivered to patient prior to discharge.

## 2012-07-07 NOTE — Discharge Summary (Signed)
Physician Discharge Summary  Patient ID: Tony [name] MRN: [id number] DOB/AGE: Apr 06, 1971 41 y.o.  Admit date: 07/05/2012 Discharge date: 07/07/2012  Admission Diagnoses:cervical 45 56 stenosis  Discharge Diagnoses: same   Discharged Condition: no weaskness  Hospital Course: surgery  Consults: none  Significant Diagnostic Studies: mri  Treatments:cervical fusion  Discharge Exam: Blood pressure 123/55, pulse 84, temperature 98 F (36.7 C), temperature source Oral, resp. rate 16, height 6\' 1"  (1.854 m), weight 117 kg (257 lb 15 oz), SpO2 94.00%. No weakness  Disposition: home     Medication List     As of 07/07/2012  9:38 AM    ASK your doctor about these medications         gabapentin 100 MG capsule   Commonly known as: NEURONTIN   Take 100-200 mg by mouth 2 (two) times daily. 100 mg every morning and 200 mg at bedtime      HYDROcodone-acetaminophen 5-325 MG per tablet   Commonly known as: NORCO/VICODIN   Take 1 tablet by mouth every 6 (six) hours as needed. For pain      methocarbamol 750 MG tablet   Commonly known as: ROBAXIN   Take 750 mg by mouth at bedtime as needed. For back pain/spasms      omeprazole 40 MG capsule   Commonly known as: PRILOSEC   Take 40 mg by mouth daily.         Signed: Karn Cassis 07/07/2012, 9:38 AM

## 2012-07-07 NOTE — Progress Notes (Signed)
Pt discharge orders received. PCA discontinued and IV taken out.  Education completed. Pt showered and Discharged home with wife with a rolling walker.

## 2012-07-07 NOTE — Clinical Social Work Note (Signed)
Clinical Social Work   CSW reviewed chart and discussed pt with RN during progression. PT is not recommending follow up. RNCM is aware. CSW is signing off as no further needs identified. Please reconsult if a need arises prior to discharge.   Victor Granados, MSW, LCSWA #[phone] 

## 2012-09-27 ENCOUNTER — Encounter: Payer: Self-pay | Admitting: *Deleted

## 2012-11-15 NOTE — Telephone Encounter (Signed)
error 

## 2013-01-04 ENCOUNTER — Encounter: Payer: Self-pay | Admitting: *Deleted

## 2013-02-01 ENCOUNTER — Other Ambulatory Visit: Payer: Self-pay | Admitting: *Deleted

## 2013-02-01 MED ORDER — ESCITALOPRAM OXALATE 10 MG PO TABS: 10.0000 mg | ORAL_TABLET | Freq: Every day | ORAL | Status: DC

## 2013-02-08 ENCOUNTER — Telehealth: Payer: Self-pay | Admitting: Family Medicine

## 2013-02-10 MED ORDER — ESCITALOPRAM OXALATE 10 MG PO TABS: 10.0000 mg | ORAL_TABLET | Freq: Every day | ORAL | Status: DC

## 2013-02-10 NOTE — Telephone Encounter (Signed)
Done for 1 month only 

## 2013-02-22 ENCOUNTER — Ambulatory Visit: Payer: Self-pay | Admitting: Family Medicine

## 2013-02-23 ENCOUNTER — Ambulatory Visit: Payer: Self-pay | Admitting: Family Medicine

## 2013-03-03 ENCOUNTER — Encounter: Payer: Self-pay | Admitting: Family Medicine

## 2013-03-03 ENCOUNTER — Ambulatory Visit (INDEPENDENT_AMBULATORY_CARE_PROVIDER_SITE_OTHER): Payer: Medicare Other | Admitting: Family Medicine

## 2013-03-03 VITALS — BP 122/76 | HR 68 | Temp 97.6°F | Wt 256.7 lb

## 2013-03-03 DIAGNOSIS — E669 Obesity, unspecified: Secondary | ICD-10-CM | POA: Insufficient documentation

## 2013-03-03 DIAGNOSIS — IMO0002 Reserved for concepts with insufficient information to code with codable children: Secondary | ICD-10-CM | POA: Insufficient documentation

## 2013-03-03 DIAGNOSIS — D759 Disease of blood and blood-forming organs, unspecified: Secondary | ICD-10-CM

## 2013-03-03 DIAGNOSIS — F329 Major depressive disorder, single episode, unspecified: Secondary | ICD-10-CM | POA: Insufficient documentation

## 2013-03-03 DIAGNOSIS — Z8719 Personal history of other diseases of the digestive system: Secondary | ICD-10-CM | POA: Insufficient documentation

## 2013-03-03 DIAGNOSIS — G4730 Sleep apnea, unspecified: Secondary | ICD-10-CM | POA: Insufficient documentation

## 2013-03-03 DIAGNOSIS — R899 Unspecified abnormal finding in specimens from other organs, systems and tissues: Secondary | ICD-10-CM | POA: Insufficient documentation

## 2013-03-03 DIAGNOSIS — K219 Gastro-esophageal reflux disease without esophagitis: Secondary | ICD-10-CM | POA: Insufficient documentation

## 2013-03-03 DIAGNOSIS — F3289 Other specified depressive episodes: Secondary | ICD-10-CM

## 2013-03-03 DIAGNOSIS — R635 Abnormal weight gain: Secondary | ICD-10-CM

## 2013-03-03 DIAGNOSIS — F32A Depression, unspecified: Secondary | ICD-10-CM | POA: Insufficient documentation

## 2013-03-03 DIAGNOSIS — R6889 Other general symptoms and signs: Secondary | ICD-10-CM

## 2013-03-03 LAB — POCT CBC
Granulocyte percent: 65.1 %G (ref 37–80)
HCT, POC: 44.5 % (ref 43.5–53.7)
Hemoglobin: 15.4 g/dL (ref 14.1–18.1)
Lymph, poc: 2.2 (ref 0.6–3.4)
MCH, POC: 30.1 pg (ref 27–31.2)
MCHC: 34.7 g/dL (ref 31.8–35.4)
MCV: 86.8 fL (ref 80–97)
MPV: 8.1 fL (ref 0–99.8)
POC Granulocyte: 4.5 (ref 2–6.9)
POC LYMPH PERCENT: 31.6 %L (ref 10–50)
Platelet Count, POC: 250 10*3/uL (ref 142–424)
RBC: 5.1 M/uL (ref 4.69–6.13)
RDW, POC: 13.1 %
WBC: 6.9 10*3/uL (ref 4.6–10.2)

## 2013-03-03 MED ORDER — ESCITALOPRAM OXALATE 20 MG PO TABS: 20.0000 mg | ORAL_TABLET | Freq: Every day | ORAL | Status: DC

## 2013-03-03 MED ORDER — PANTOPRAZOLE SODIUM 40 MG PO TBEC: 40.0000 mg | DELAYED_RELEASE_TABLET | Freq: Every day | ORAL | Status: DC

## 2013-03-03 NOTE — Patient Instructions (Signed)
      Dr Izzah Pasqua's Recommendations  For nutrition information, I recommend books:  1).Eat to Live by Dr Joel Fuhrman. 2).Prevent and Reverse Heart Disease by Dr Caldwell Esselstyn. 3) Dr Neal Barnard's Book:  Program to Reverse Diabetes  Exercise recommendations are:  If unable to walk, then the patient can exercise in a chair 3 times a day. By flapping arms like a bird gently and raising legs outwards to the front.  If ambulatory, the patient can go for walks for 30 minutes 3 times a week. Then increase the intensity and duration as tolerated.  Goal is to try to attain exercise frequency to 5 times a week.  If applicable: Best to perform resistance exercises (machines or weights) 2 days a week and cardio type exercises 3 days per week.  

## 2013-03-03 NOTE — Progress Notes (Signed)
Patient ID: Tony [name], male   DOB: Nov 02, 1970, 42 y.o.   MRN: [id number] SUBJECTIVE: CC: Chief Complaint  Patient presents with  . Follow-up    6 month follow up lexapro needs increasng and somthing other than omperazolw    HPI: 1) omeprazole doesn't seem to work anymore. Breakfast: milk and oatmeal and poptart, sometimes a biscuit Lunch:sandwich: chicken or [place] Supper:fish sticks macaroni and cheese.  Occupation: disabled , fell from a tree stand and fractured his back and has rods in his back. Goes hunting with a safety harness, doesn't drive  atruck anymore.  Exercise walks a bunch.  2) needs a refill on Lexapro. Has need for anger management per his wife. Its as if he has too much time on his hands and not doing anything with it.  Past Medical History  Diagnosis Date  . Sleep apnea     no cpcp yet  . H/O hiatal hernia   . Blood dyscrasia   . DDD (degenerative disc disease)   . GERD (gastroesophageal reflux disease)   . Depression    Past Surgical History  Procedure Laterality Date  . Back surgery  August 2003  . Fracture surgery      knee surgery,fell  . Appendectomy    . Hernia repair    . Anterior cervical decomp/discectomy fusion  07/05/2012    Procedure: ANTERIOR CERVICAL DECOMPRESSION/DISCECTOMY FUSION 2 LEVELS;  Surgeon: Karn Cassis, MD;  Location: [facility];  Service: Neurosurgery;  Laterality: N/A;  Anterior Cervical Four-Five/Five-Six Decompression/Diskectomy and Fusion    History   Social History  . Marital Status: Married    Spouse Name: N/A    Number of Children: N/A  . Years of Education: N/A   Occupational History  . Not on file.   Social History Main Topics  . Smoking status: Current Some Day Smoker -- 0.25 packs/day    Types: Cigarettes  . Smokeless tobacco: Not on file  . Alcohol Use: Yes     Comment: social  . Drug Use: No  . Sexual Activity:    Other Topics Concern  . Not on file   Social History Narrative  . No  narrative on file   No family history on file. Current Outpatient Prescriptions on File Prior to Visit  Medication Sig Dispense Refill  . gabapentin (NEURONTIN) 100 MG capsule Take 100-200 mg by mouth 2 (two) times daily. 100 mg every morning and 200 mg at bedtime      . HYDROcodone-acetaminophen (NORCO/VICODIN) 5-325 MG per tablet Take 1 tablet by mouth every 6 (six) hours as needed. For pain      . methocarbamol (ROBAXIN) 750 MG tablet Take 750 mg by mouth at bedtime as needed. For back pain/spasms       No current facility-administered medications on file prior to visit.   No Known Allergies Immunization History  Administered Date(s) Administered  . Influenza Split 07/07/2012  . Pneumococcal Polysaccharide 07/07/2012   Prior to Admission medications   Medication Sig Start Date End Date Taking? Authorizing Provider  escitalopram (LEXAPRO) 20 MG tablet Take 1 tablet (20 mg total) by mouth daily. 03/03/13  Yes Ileana Ladd, MD  gabapentin (NEURONTIN) 100 MG capsule Take 100-200 mg by mouth 2 (two) times daily. 100 mg every morning and 200 mg at bedtime   Yes Historical Provider, MD  HYDROcodone-acetaminophen (NORCO/VICODIN) 5-325 MG per tablet Take 1 tablet by mouth every 6 (six) hours as needed. For pain   Yes Historical  Provider, MD  methocarbamol (ROBAXIN) 750 MG tablet Take 750 mg by mouth at bedtime as needed. For back pain/spasms   Yes Historical Provider, MD  traMADol (ULTRAM) 50 MG tablet  01/17/13  Yes Historical Provider, MD  pantoprazole (PROTONIX) 40 MG tablet Take 1 tablet (40 mg total) by mouth daily. 03/03/13   Ileana Ladd, MD     ROS: As above in the HPI. All other systems are stable or negative.  OBJECTIVE: APPEARANCE:  Patient in no acute distress.The patient appeared well nourished and normally developed. Acyanotic. Waist: 43.5 inches VITAL SIGNS:BP 122/76  Pulse 68  Temp(Src) 97.6 F (36.4 C) (Oral)  Wt 256 lb 11.2 oz (116.438 kg)  BMI 33.87 kg/m2 WM  obese.  SKIN: warm and  Dry without overt rashes, tattoos and scars  HEAD and Neck: without JVD, Head and scalp: normal Eyes:No scleral icterus. Fundi normal, eye movements normal. Ears: Auricle normal, canal normal, Tympanic membranes normal, insufflation normal. Nose: normal Throat: normal Neck & thyroid: normal  CHEST & LUNGS: Chest wall: normal Lungs: Clear  CVS: Reveals the PMI to be normally located. Regular rhythm, First and Second Heart sounds are normal,  absence of murmurs, rubs or gallops. Peripheral vasculature: Radial pulses: normal Dorsal pedis pulses: normal Posterior pulses: normal  ABDOMEN:  Appearance: normal Benign, no organomegaly, no masses, no Abdominal Aortic enlargement. No Guarding , no rebound. No Bruits. Bowel sounds: normal  RECTAL: N/A GU: N/A  EXTREMETIES: nonedematous.  MUSCULOSKELETAL:  Spine: normal Joints: intact  NEUROLOGIC: oriented to time,place and person; nonfocal. Strength is normal Sensory is normal Reflexes are normal Cranial Nerves are normal.  ASSESSMENT: Blood dyscrasia - Plan: POCT CBC  DDD (degenerative disc disease)  Depression - Plan: escitalopram (LEXAPRO) 20 MG tablet, POCT CBC, CMP14+EGFR, Vitamin B12, TSH, Folate, DISCONTINUED: escitalopram (LEXAPRO) 20 MG tablet  H/O hiatal hernia  GERD (gastroesophageal reflux disease) - Plan: pantoprazole (PROTONIX) 40 MG tablet, DISCONTINUED: pantoprazole (PROTONIX) 40 MG tablet  Sleep apnea  Obesity, unspecified - Plan: NMR, lipoprofile  Abnormal laboratory test - Plan: NMR, lipoprofile  PLAN: Orders Placed This Encounter  Procedures  . CMP14+EGFR  . NMR, lipoprofile  . Vitamin B12  . TSH  . Folate  . POCT CBC    Meds ordered this encounter  Medications  . DISCONTD: escitalopram (LEXAPRO) 10 MG tablet    Sig:   . traMADol (ULTRAM) 50 MG tablet    Sig:   . DISCONTD: omeprazole (PRILOSEC) 40 MG capsule    Sig:   . DISCONTD: gabapentin (NEURONTIN)  100 MG capsule    Sig:   . DISCONTD: escitalopram (LEXAPRO) 20 MG tablet    Sig: Take 1 tablet (20 mg total) by mouth daily.    Dispense:  30 tablet    Refill:  5    Needs to be seen before next refill  . DISCONTD: pantoprazole (PROTONIX) 40 MG tablet    Sig: Take 1 tablet (40 mg total) by mouth daily.    Dispense:  30 tablet    Refill:  5  . escitalopram (LEXAPRO) 20 MG tablet    Sig: Take 1 tablet (20 mg total) by mouth daily.    Dispense:  30 tablet    Refill:  5    Needs to be seen before next refill  . pantoprazole (PROTONIX) 40 MG tablet    Sig: Take 1 tablet (40 mg total) by mouth daily.    Dispense:  30 tablet    Refill:  5  Recommended counselling and gave him th elist of providers for counselling and psychiatrists available in the area.  Spent 1 hour on counselling and marital guidance and behaviour modification techniques qith the patient. Consumption of 1 hour time discusssing the successful treatment techniques with counselling vs  High risk drugs with potential for addiction.  Wife agrees to management plans.  GERD precautions.  exercise in a chair.      Dr Woodroe Mode Recommendations  For nutrition information, I recommend books:  1).Eat to Live by Dr Monico Hoar. 2).Prevent and Reverse Heart Disease by Dr Suzzette Righter. 3) Dr Katherina Right Book:  Program to Reverse Diabetes  Exercise recommendations are:  If unable to walk, then the patient can exercise in a chair 3 times a day. By flapping arms like a bird gently and raising legs outwards to the front.  If ambulatory, the patient can go for walks for 30 minutes 3 times a week. Then increase the intensity and duration as tolerated.  Goal is to try to attain exercise frequency to 5 times a week.  If applicable: Best to perform resistance exercises (machines or weights) 2 days a week and cardio type exercises 3 days per week.   Smoking cessation counselling.  Return in about 2 months  (around 05/03/2013) for Recheck medical problems.  Laverle Pillard P. Modesto Charon, M.D.

## 2013-03-05 LAB — CMP14+EGFR
ALT: 23 IU/L (ref 0–44)
AST: 18 IU/L (ref 0–40)
Albumin/Globulin Ratio: 2.1 (ref 1.1–2.5)
Albumin: 4.5 g/dL (ref 3.5–5.5)
Alkaline Phosphatase: 100 IU/L (ref 39–117)
BUN/Creatinine Ratio: 12 (ref 9–20)
BUN: 12 mg/dL (ref 6–24)
CO2: 21 mmol/L (ref 18–29)
Calcium: 9.8 mg/dL (ref 8.7–10.2)
Chloride: 102 mmol/L (ref 97–108)
Creatinine, Ser: 1.02 mg/dL (ref 0.76–1.27)
GFR calc Af Amer: 105 mL/min/{1.73_m2} (ref >59)
GFR calc non Af Amer: 91 mL/min/{1.73_m2} (ref >59)
Globulin, Total: 2.1 g/dL (ref 1.5–4.5)
Glucose: 85 mg/dL (ref 65–99)
Potassium: 4.4 mmol/L (ref 3.5–5.2)
Sodium: 139 mmol/L (ref 134–144)
Total Bilirubin: 0.7 mg/dL (ref 0.0–1.2)
Total Protein: 6.6 g/dL (ref 6.0–8.5)

## 2013-03-05 LAB — NMR, LIPOPROFILE
Cholesterol: 211 mg/dL — ABNORMAL HIGH (ref <200)
HDL Cholesterol by NMR: 40 mg/dL (ref >=40)
HDL Particle Number: 30.3 umol/L — ABNORMAL LOW (ref >=30.5)
LDL Particle Number: 2286 nmol/L — ABNORMAL HIGH (ref <1000)
LDL Size: 19.8 nm — ABNORMAL LOW (ref >20.5)
LDLC SERPL CALC-MCNC: 118 mg/dL — ABNORMAL HIGH (ref <100)
LP-IR Score: 76 — ABNORMAL HIGH (ref <=45)
Small LDL Particle Number: 1674 nmol/L — ABNORMAL HIGH (ref <=527)
Triglycerides by NMR: 263 mg/dL — ABNORMAL HIGH (ref <150)

## 2013-03-05 LAB — FOLATE: Folate: 11.7 ng/mL (ref >3.0)

## 2013-03-05 LAB — VITAMIN B12: Vitamin B-12: 404 pg/mL (ref 211–946)

## 2013-03-05 LAB — TSH: TSH: 2.42 u[IU]/mL (ref 0.450–4.500)

## 2013-03-08 ENCOUNTER — Other Ambulatory Visit: Payer: Self-pay | Admitting: Family Medicine

## 2013-03-08 MED ORDER — ATORVASTATIN CALCIUM 20 MG PO TABS: 20.0000 mg | ORAL_TABLET | Freq: Every day | ORAL | Status: DC

## 2013-04-20 ENCOUNTER — Ambulatory Visit (HOSPITAL_COMMUNITY): Payer: Medicare Other | Admitting: Psychiatry

## 2013-05-03 ENCOUNTER — Ambulatory Visit: Payer: Medicare Other | Admitting: Family Medicine

## 2013-05-11 ENCOUNTER — Ambulatory Visit (INDEPENDENT_AMBULATORY_CARE_PROVIDER_SITE_OTHER): Payer: Medicare Other | Admitting: Psychiatry

## 2013-05-11 ENCOUNTER — Encounter (HOSPITAL_COMMUNITY): Payer: Self-pay | Admitting: Psychiatry

## 2013-05-11 VITALS — BP 104/64 | HR 65 | Ht 73.0 in | Wt 257.0 lb

## 2013-05-11 DIAGNOSIS — F6381 Intermittent explosive disorder: Secondary | ICD-10-CM

## 2013-05-11 DIAGNOSIS — F39 Unspecified mood [affective] disorder: Secondary | ICD-10-CM

## 2013-05-11 MED ORDER — ARIPIPRAZOLE 20 MG PO TABS: 20.0000 mg | ORAL_TABLET | Freq: Every day | ORAL | Status: DC

## 2013-05-11 MED ORDER — SERTRALINE HCL 100 MG PO TABS: 200.0000 mg | ORAL_TABLET | Freq: Every day | ORAL | Status: DC
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
## 2013-05-12 DIAGNOSIS — F39 Unspecified mood [affective] disorder: Secondary | ICD-10-CM | POA: Insufficient documentation

## 2013-05-12 DIAGNOSIS — F6381 Intermittent explosive disorder: Secondary | ICD-10-CM | POA: Insufficient documentation

## 2013-06-05 ENCOUNTER — Encounter: Payer: Medicare Other | Admitting: Nurse Practitioner

## 2013-06-05 NOTE — Progress Notes (Signed)
   Subjective:    Patient ID: Tony [name], male    DOB: 1971-01-19, 42 y.o.   MRN: [id number]  HPI    Review of Systems     Objective:   Physical Exam        Assessment & Plan:  No show

## 2013-06-09 ENCOUNTER — Ambulatory Visit (INDEPENDENT_AMBULATORY_CARE_PROVIDER_SITE_OTHER): Payer: Medicare Other | Admitting: Psychiatry

## 2013-06-09 ENCOUNTER — Encounter (HOSPITAL_COMMUNITY): Payer: Self-pay | Admitting: Psychiatry

## 2013-06-09 VITALS — BP 114/72 | HR 65 | Ht 73.0 in | Wt 265.0 lb

## 2013-06-09 DIAGNOSIS — F39 Unspecified mood [affective] disorder: Secondary | ICD-10-CM

## 2013-06-09 DIAGNOSIS — F6381 Intermittent explosive disorder: Secondary | ICD-10-CM

## 2013-06-09 DIAGNOSIS — F319 Bipolar disorder, unspecified: Secondary | ICD-10-CM

## 2013-06-09 MED ORDER — ARIPIPRAZOLE 10 MG PO TABS: 10.0000 mg | ORAL_TABLET | Freq: Every day | ORAL | Status: DC

## 2013-06-09 MED ORDER — SERTRALINE HCL 50 MG PO TABS: 50.0000 mg | ORAL_TABLET | Freq: Every day | ORAL | Status: DC
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
## 2013-07-13 ENCOUNTER — Ambulatory Visit (HOSPITAL_COMMUNITY): Payer: Medicare Other | Admitting: Psychiatry

## 2013-08-10 ENCOUNTER — Ambulatory Visit (INDEPENDENT_AMBULATORY_CARE_PROVIDER_SITE_OTHER): Payer: Medicare Other | Admitting: Psychiatry

## 2013-08-10 ENCOUNTER — Encounter (HOSPITAL_COMMUNITY): Payer: Self-pay | Admitting: Psychiatry

## 2013-08-10 ENCOUNTER — Encounter (INDEPENDENT_AMBULATORY_CARE_PROVIDER_SITE_OTHER): Payer: Self-pay

## 2013-08-10 VITALS — BP 113/73 | HR 77 | Wt 263.0 lb

## 2013-08-10 DIAGNOSIS — F39 Unspecified mood [affective] disorder: Secondary | ICD-10-CM

## 2013-08-10 DIAGNOSIS — F6381 Intermittent explosive disorder: Secondary | ICD-10-CM

## 2013-08-10 MED ORDER — ARIPIPRAZOLE 10 MG PO TABS: 10.0000 mg | ORAL_TABLET | Freq: Every day | ORAL | Status: DC

## 2013-08-10 MED ORDER — SERTRALINE HCL 50 MG PO TABS: 50.0000 mg | ORAL_TABLET | Freq: Every day | ORAL | Status: DC
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
## 2013-08-21 ENCOUNTER — Telehealth: Payer: Self-pay | Admitting: *Deleted

## 2013-08-21 NOTE — Telephone Encounter (Signed)
Last seen in office on 03-03-13. Was to return in 2 months for follow up and did not. Please advise

## 2013-08-25 NOTE — Telephone Encounter (Signed)
If he is being seen regularly by psychiatry for his medications and trreatment , then we only need to see him for medical problems, not psychiatry related.

## 2013-09-04 NOTE — Telephone Encounter (Signed)
Left message for pt to return call.

## 2013-09-07 NOTE — Telephone Encounter (Signed)
Pt not returned call.

## 2013-09-14 ENCOUNTER — Other Ambulatory Visit: Payer: Self-pay | Admitting: *Deleted

## 2013-09-14 DIAGNOSIS — K219 Gastro-esophageal reflux disease without esophagitis: Secondary | ICD-10-CM

## 2013-09-14 MED ORDER — PANTOPRAZOLE SODIUM 40 MG PO TBEC: 40.0000 mg | DELAYED_RELEASE_TABLET | Freq: Every day | ORAL | Status: AC

## 2013-09-14 NOTE — Telephone Encounter (Signed)
Last ov 03/03/13

## 2013-09-14 NOTE — Telephone Encounter (Signed)
Patient needs to be seen. Has exceeded time since last visit. Limited quantity refilled. Needs to bring all medications to next appointment.   

## 2013-09-21 ENCOUNTER — Encounter (HOSPITAL_COMMUNITY): Payer: Self-pay | Admitting: Psychiatry

## 2013-09-21 ENCOUNTER — Ambulatory Visit (INDEPENDENT_AMBULATORY_CARE_PROVIDER_SITE_OTHER): Payer: Medicare Other | Admitting: Psychiatry

## 2013-09-21 ENCOUNTER — Encounter (INDEPENDENT_AMBULATORY_CARE_PROVIDER_SITE_OTHER): Payer: Self-pay

## 2013-09-21 VITALS — BP 109/64 | HR 76 | Wt 262.0 lb

## 2013-09-21 DIAGNOSIS — F3189 Other bipolar disorder: Secondary | ICD-10-CM

## 2013-09-21 DIAGNOSIS — F3181 Bipolar II disorder: Secondary | ICD-10-CM

## 2013-09-21 DIAGNOSIS — F6381 Intermittent explosive disorder: Secondary | ICD-10-CM

## 2013-09-21 MED ORDER — SERTRALINE HCL 50 MG PO TABS: 50.0000 mg | ORAL_TABLET | Freq: Every day | ORAL | Status: DC

## 2013-09-21 MED ORDER — ARIPIPRAZOLE 10 MG PO TABS: 10.0000 mg | ORAL_TABLET | Freq: Every day | ORAL | Status: DC

## 2013-09-21 NOTE — Patient Instructions (Signed)
Please have your primary care doctor perform a Hemoglobin A1c and Fasting Lipid profile as you are taking Abilify.
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
## 2013-12-18 ENCOUNTER — Encounter (HOSPITAL_BASED_OUTPATIENT_CLINIC_OR_DEPARTMENT_OTHER): Payer: Self-pay | Admitting: Emergency Medicine

## 2013-12-18 ENCOUNTER — Emergency Department (HOSPITAL_BASED_OUTPATIENT_CLINIC_OR_DEPARTMENT_OTHER)
Admission: EM | Admit: 2013-12-18 | Discharge: 2013-12-18 | Disposition: A | Discharge status: Home / Self Care | Payer: Medicare Other | Attending: Emergency Medicine | Admitting: Emergency Medicine

## 2013-12-18 DIAGNOSIS — Z87891 Personal history of nicotine dependence: Secondary | ICD-10-CM | POA: Insufficient documentation

## 2013-12-18 DIAGNOSIS — F319 Bipolar disorder, unspecified: Secondary | ICD-10-CM | POA: Insufficient documentation

## 2013-12-18 DIAGNOSIS — Z79899 Other long term (current) drug therapy: Secondary | ICD-10-CM | POA: Insufficient documentation

## 2013-12-18 DIAGNOSIS — F329 Major depressive disorder, single episode, unspecified: Secondary | ICD-10-CM

## 2013-12-18 DIAGNOSIS — Z862 Personal history of diseases of the blood and blood-forming organs and certain disorders involving the immune mechanism: Secondary | ICD-10-CM | POA: Insufficient documentation

## 2013-12-18 DIAGNOSIS — F32A Depression, unspecified: Secondary | ICD-10-CM

## 2013-12-18 DIAGNOSIS — Z8739 Personal history of other diseases of the musculoskeletal system and connective tissue: Secondary | ICD-10-CM | POA: Insufficient documentation

## 2013-12-18 DIAGNOSIS — R638 Other symptoms and signs concerning food and fluid intake: Secondary | ICD-10-CM | POA: Insufficient documentation

## 2013-12-18 DIAGNOSIS — K219 Gastro-esophageal reflux disease without esophagitis: Secondary | ICD-10-CM | POA: Insufficient documentation

## 2013-12-18 LAB — CBC WITH DIFFERENTIAL/PLATELET
Basophils Absolute: 0 10*3/uL (ref 0.0–0.1)
Basophils Relative: 1 % (ref 0–1)
Eosinophils Absolute: 0.1 10*3/uL (ref 0.0–0.7)
Eosinophils Relative: 1 % (ref 0–5)
HCT: 43.1 % (ref 39.0–52.0)
HEMOGLOBIN: 14.8 g/dL (ref 13.0–17.0)
Lymphocytes Relative: 37 % (ref 12–46)
Lymphs Abs: 2.3 10*3/uL (ref 0.7–4.0)
MCH: 28.1 pg (ref 26.0–34.0)
MCHC: 34.3 g/dL (ref 30.0–36.0)
MCV: 81.9 fL (ref 78.0–100.0)
MONO ABS: 0.5 10*3/uL (ref 0.1–1.0)
MONOS PCT: 8 % (ref 3–12)
NEUTROS ABS: 3.4 10*3/uL (ref 1.7–7.7)
Neutrophils Relative %: 54 % (ref 43–77)
Platelets: 240 10*3/uL (ref 150–400)
RBC: 5.26 MIL/uL (ref 4.22–5.81)
RDW: 15.4 % (ref 11.5–15.5)
WBC: 6.2 10*3/uL (ref 4.0–10.5)

## 2013-12-18 LAB — COMPREHENSIVE METABOLIC PANEL
ALK PHOS: 112 U/L (ref 39–117)
ALT: 17 U/L (ref 0–53)
AST: 16 U/L (ref 0–37)
Albumin: 4 g/dL (ref 3.5–5.2)
BILIRUBIN TOTAL: 0.7 mg/dL (ref 0.3–1.2)
BUN: 14 mg/dL (ref 6–23)
CHLORIDE: 105 meq/L (ref 96–112)
CO2: 23 mEq/L (ref 19–32)
Calcium: 9.3 mg/dL (ref 8.4–10.5)
Creatinine, Ser: 1 mg/dL (ref 0.50–1.35)
GLUCOSE: 85 mg/dL (ref 70–99)
POTASSIUM: 3.5 meq/L — AB (ref 3.7–5.3)
Sodium: 141 mEq/L (ref 137–147)
Total Protein: 7 g/dL (ref 6.0–8.3)

## 2013-12-18 LAB — RAPID URINE DRUG SCREEN, HOSP PERFORMED
Amphetamines: NOT DETECTED
Barbiturates: NOT DETECTED
Benzodiazepines: NOT DETECTED
Cocaine: NOT DETECTED
Opiates: NOT DETECTED
Tetrahydrocannabinol: NOT DETECTED

## 2013-12-18 LAB — ETHANOL: Alcohol, Ethyl (B): <11 mg/dL (ref 0–11)

## 2013-12-18 NOTE — ED Notes (Signed)
Call placed to TTS regarding delay in assessment, TTS states it will be approx 1 1/2 hours more before they can complete the assessment. Pt and family made aware.

## 2013-12-18 NOTE — ED Notes (Signed)
TTS consult in progress. °
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
## 2013-12-18 NOTE — ED Notes (Signed)
Depression. States all he does is sleep.

## 2013-12-18 NOTE — ED Notes (Signed)
RT at bedside for lab work.

## 2013-12-18 NOTE — ED Provider Notes (Signed)
CSN: [id number]     Arrival date & time 12/18/13  1722 History  This chart was scribed for Richardean Canalavid H Yao, MD by Phillis HaggisGabriella Gaje, ED Scribe. This patient was seen in room MH12/MH12 and patient care was started at 5:44 PM.     Chief Complaint  Patient presents with  . Medical Clearance   The history is provided by the patient and the spouse. No language interpreter was used.   HPI Comments: Tony [name] is a 43 y.o. male with a history of bipolar depression who presents to the Emergency Department for medical clearance. Patient was brought in by his wife for depression. He states that he is currently feeling worthless and does not feel like doing anything. He states that he has been sleeping a lot and has had a loss of appetite. Patient denies SI, HI, or hallucinations. The patient's wife states that she noticed that the patient was not taking his medication, Abilify and Zoloft, after he got pulled over one month ago. His wife states that the patient has lost almost 20 pounds and that he will not do anything by himself. She states that she feels that he acts like a "43 year old child." She says that he will not speak unless spoken to and that she will have to wake him up everyday. Patient reports that he has a history of psychiatric hospitalization, back in 2007, but states that he does not feel as bad as he did back then. His wife states that he had back surgery in December and has lost a lot of mobility. She believes that the "reality" of not being able to go back to work is what is most depressing for him. Patient states that he is now back on his medication. Wife states that they were unable to contact their PCP for this problem.    Past Medical History  Diagnosis Date  . Sleep apnea     no cpcp yet  . H/O hiatal hernia   . Blood dyscrasia   . DDD (degenerative disc disease)   . GERD (gastroesophageal reflux disease)   . Depression    Past Surgical History  Procedure Laterality Date  .  Back surgery  August 2003  . Fracture surgery      knee surgery,fell  . Appendectomy    . Hernia repair    . Anterior cervical decomp/discectomy fusion  07/05/2012    Procedure: ANTERIOR CERVICAL DECOMPRESSION/DISCECTOMY FUSION 2 LEVELS;  Surgeon: Karn CassisErnesto M Botero, MD;  Location: [facility];  Service: Neurosurgery;  Laterality: N/A;  Anterior Cervical Four-Five/Five-Six Decompression/Diskectomy and Fusion    Family History  Problem Relation Age of Onset  . Cancer - Other Maternal Grandmother   . Dementia Paternal Grandfather   . Cancer - Other Paternal Grandfather   . Anxiety disorder Neg Hx   . Depression Neg Hx   . Bipolar disorder Neg Hx    History  Substance Use Topics  . Smoking status: Former Smoker -- 0.10 packs/day    Types: Cigarettes    Start date: 09/07/2013  . Smokeless tobacco: Not on file  . Alcohol Use: No     Comment: Not for the past month    Review of Systems  Constitutional: Positive for appetite change (loss of appetite) and unexpected weight change (lost about 20 lbs).  Skin: Negative for wound.  Psychiatric/Behavioral: Positive for dysphoric mood. Negative for suicidal ideas, hallucinations and self-injury.  All other systems reviewed and are negative.  Allergies  Review of patient's allergies indicates no known allergies.  Home Medications   Prior to Admission medications   Medication Sig Start Date End Date Taking? Authorizing Provider  ARIPiprazole (ABILIFY) 10 MG tablet Take 1 tablet (10 mg total) by mouth daily. 09/21/13   Larena Sox, MD  pantoprazole (PROTONIX) 40 MG tablet Take 1 tablet (40 mg total) by mouth daily. 09/14/13   Ileana Ladd, MD  sertraline (ZOLOFT) 50 MG tablet Take 1 tablet (50 mg total) by mouth daily. 09/21/13   Larena Sox, MD   BP 114/83  Pulse 88  Temp(Src) 97.8 F (36.6 C) (Oral)  Resp 18  Ht 6\' 3"  (1.905 m)  Wt 250 lb (113.399 kg)  BMI 31.25 kg/m2  SpO2 98% Physical Exam  Nursing note and vitals  reviewed. Constitutional: He is oriented to person, place, and time. He appears well-developed and well-nourished. He appears lethargic.  HENT:  Head: Normocephalic and atraumatic.  Eyes: EOM are normal.  Neck: Normal range of motion. Neck supple.  Cardiovascular: Normal rate and normal heart sounds.   Pulmonary/Chest: Effort normal and breath sounds normal.  Abdominal: There is no tenderness.  Musculoskeletal: Normal range of motion.  Neurological: He is oriented to person, place, and time. He appears lethargic.  Skin: Skin is warm and dry.  Psychiatric: His behavior is normal. He exhibits a depressed mood. He expresses no homicidal and no suicidal ideation.  Poor judgement   ED Course  Procedures (including critical care time) DIAGNOSTIC STUDIES: Oxygen Saturation is 98% on room air, normal by my interpretation.    COORDINATION OF CARE: 5:48 PM-Discussed treatment plan which includes telecommunication with psychiatrist with possibility of transfer with pt at bedside and pt agreed to plan.   Medications - No data to display  Results for orders placed during the hospital encounter of 12/18/13  CBC WITH DIFFERENTIAL      Result Value Ref Range   WBC 6.2  4.0 - 10.5 K/uL   RBC 5.26  4.22 - 5.81 MIL/uL   Hemoglobin 14.8  13.0 - 17.0 g/dL   HCT 16.1  09.6 - 04.5 %   MCV 81.9  78.0 - 100.0 fL   MCH 28.1  26.0 - 34.0 pg   MCHC 34.3  30.0 - 36.0 g/dL   RDW 40.9  81.1 - 91.4 %   Platelets 240  150 - 400 K/uL   Neutrophils Relative % 54  43 - 77 %   Neutro Abs 3.4  1.7 - 7.7 K/uL   Lymphocytes Relative 37  12 - 46 %   Lymphs Abs 2.3  0.7 - 4.0 K/uL   Monocytes Relative 8  3 - 12 %   Monocytes Absolute 0.5  0.1 - 1.0 K/uL   Eosinophils Relative 1  0 - 5 %   Eosinophils Absolute 0.1  0.0 - 0.7 K/uL   Basophils Relative 1  0 - 1 %   Basophils Absolute 0.0  0.0 - 0.1 K/uL  COMPREHENSIVE METABOLIC PANEL      Result Value Ref Range   Sodium 141  137 - 147 mEq/L   Potassium 3.5 (*)  3.7 - 5.3 mEq/L   Chloride 105  96 - 112 mEq/L   CO2 23  19 - 32 mEq/L   Glucose, Bld 85  70 - 99 mg/dL   BUN 14  6 - 23 mg/dL   Creatinine, Ser 7.82  0.50 - 1.35 mg/dL   Calcium 9.3  8.4 - 95.6 mg/dL  Total Protein 7.0  6.0 - 8.3 g/dL   Albumin 4.0  3.5 - 5.2 g/dL   AST 16  0 - 37 U/L   ALT 17  0 - 53 U/L   Alkaline Phosphatase 112  39 - 117 U/L   Total Bilirubin 0.7  0.3 - 1.2 mg/dL   GFR calc non Af Amer >90  >90 mL/min   GFR calc Af Amer >90  >90 mL/min  ETHANOL      Result Value Ref Range   Alcohol, Ethyl (B) <11  0 - 11 mg/dL  URINE RAPID DRUG SCREEN (HOSP PERFORMED)      Result Value Ref Range   Opiates NONE DETECTED  NONE DETECTED   Cocaine NONE DETECTED  NONE DETECTED   Benzodiazepines NONE DETECTED  NONE DETECTED   Amphetamines NONE DETECTED  NONE DETECTED   Tetrahydrocannabinol NONE DETECTED  NONE DETECTED   Barbiturates NONE DETECTED  NONE DETECTED   No results found.    EKG Interpretation None      MDM   Final diagnoses:  None   Tony [name] is a 43 y.o. male here with depression. Labs at baseline. TTS consulted and felt that patient doesn't meet inpatient criteria. [facility] to [facility] in AM to arrange for follow up. Strict return precautions given.    I personally performed the services described in this documentation, which was scribed in my presence. The recorded information has been reviewed and is accurate.    Richardean Canalavid H Yao, MD 12/18/13 2106

## 2013-12-18 NOTE — Discharge Instructions (Signed)
Take your meds as prescribed.   [facility]. If you can't reach anyone, [facility] at [phone]. Please ask for Baruch MerlEric Foushee. He is the Interior and spatial designerdirector of the outpatient clinics. He can help arrange close follow up.   Return to ER if you have thoughts of harming yourself or others, worse depression, hallucinations.

## 2013-12-19 ENCOUNTER — Telehealth (HOSPITAL_COMMUNITY): Payer: Self-pay

## 2013-12-20 ENCOUNTER — Ambulatory Visit (INDEPENDENT_AMBULATORY_CARE_PROVIDER_SITE_OTHER): Payer: Medicare Other | Admitting: Psychiatry

## 2013-12-20 ENCOUNTER — Encounter (HOSPITAL_COMMUNITY): Payer: Self-pay | Admitting: Psychiatry

## 2013-12-20 VITALS — HR 88 | Ht 74.0 in | Wt 255.0 lb

## 2013-12-20 DIAGNOSIS — F3181 Bipolar II disorder: Secondary | ICD-10-CM

## 2013-12-20 DIAGNOSIS — F6381 Intermittent explosive disorder: Secondary | ICD-10-CM

## 2013-12-20 DIAGNOSIS — F3289 Other specified depressive episodes: Secondary | ICD-10-CM

## 2013-12-20 DIAGNOSIS — F329 Major depressive disorder, single episode, unspecified: Secondary | ICD-10-CM

## 2013-12-20 MED ORDER — ARIPIPRAZOLE 10 MG PO TABS: 15.0000 mg | ORAL_TABLET | Freq: Every day | ORAL | Status: DC

## 2013-12-20 MED ORDER — SERTRALINE HCL 50 MG PO TABS: 50.0000 mg | ORAL_TABLET | Freq: Every day | ORAL | Status: DC

## 2013-12-20 NOTE — Progress Notes (Signed)
Patient ID: Tony [name], male   DOB: 17-Jan-1971, 43 y.o.   MRN: [id number] [facility] Follow-up Outpatient Visit  Tony [name] [id number] 43 y.o.  12/20/2013  Chief Complaint: Depression     History of Present Illness:   Patient returns for Medication Follow up and is diagnosed with depressive disorder possible bipolar disorder type II.  Patient has been on Zoloft and Abilify apparently he stopped his medication 3 months ago for a while believes that he was doing better his recurrence of depression. He is more withdrawn increased sleep decreased energy increased fatigue decrease in concentration anhedonia does not want to be out or hunt which he used to do. Denies suicidal or homicidal thoughts  He visited the ER and was referred for followup. He restarted his medication but is feeling foggy in the morning time. He is increase his dose to 100 mg of Zoloft.  Duration of depression 2-4 years. Severity 3/10.  Aggravating factors include back condition and immobility he hurt his back while he was hunting deer hunt he built fell down on him. He also recently got a ticket for not having his seatbelt.  Modifying factors. Supportive wife and daughters.   Side effects possible some fogginess takes this medication in the morning.  He continues to use his CPAP machine for sleep apnea but endorses feeling foggy during the daytime and withdrawn from his usual activities. 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  Past Medical History  Diagnosis Date  . Sleep apnea     no cpcp yet  . H/O hiatal hernia   . Blood dyscrasia   . DDD (degenerative disc disease)   . GERD (gastroesophageal reflux disease)   . Depression    Family History  Problem Relation Age of Onset  . Cancer - Other Maternal Grandmother   . Dementia Paternal Grandfather   . Cancer - Other Paternal Grandfather   . Anxiety disorder Neg Hx   . Depression  Neg Hx   . Bipolar disorder Neg Hx     Outpatient Encounter Prescriptions as of 12/20/2013  Medication Sig  . ARIPiprazole (ABILIFY) 10 MG tablet Take 1.5 tablets (15 mg total) by mouth daily.  . pantoprazole (PROTONIX) 40 MG tablet Take 1 tablet (40 mg total) by mouth daily.  . sertraline (ZOLOFT) 50 MG tablet Take 1 tablet (50 mg total) by mouth daily.  . [DISCONTINUED] ARIPiprazole (ABILIFY) 10 MG tablet Take 1 tablet (10 mg total) by mouth daily.  . [DISCONTINUED] sertraline (ZOLOFT) 50 MG tablet Take 1 tablet (50 mg total) by mouth daily.    Recent Results (from the past 2160 hour(s))  CBC WITH DIFFERENTIAL     Status: None   Collection Time    12/18/13  6:35 PM      Result Value Ref Range   WBC 6.2  4.0 - 10.5 K/uL   RBC 5.26  4.22 - 5.81 MIL/uL   Hemoglobin 14.8  13.0 - 17.0 g/dL   HCT 43.1  39.0 - 52.0 %   MCV 81.9  78.0 - 100.0 fL   MCH 28.1  26.0 - 34.0 pg   MCHC 34.3  30.0 - 36.0 g/dL   RDW 15.4  11.5 - 15.5 %   Platelets 240  150 - 400 K/uL   Neutrophils Relative % 54  43 - 77 %   Neutro Abs 3.4  1.7 - 7.7 K/uL   Lymphocytes Relative  37  12 - 46 %   Lymphs Abs 2.3  0.7 - 4.0 K/uL   Monocytes Relative 8  3 - 12 %   Monocytes Absolute 0.5  0.1 - 1.0 K/uL   Eosinophils Relative 1  0 - 5 %   Eosinophils Absolute 0.1  0.0 - 0.7 K/uL   Basophils Relative 1  0 - 1 %   Basophils Absolute 0.0  0.0 - 0.1 K/uL  COMPREHENSIVE METABOLIC PANEL     Status: Abnormal   Collection Time    12/18/13  6:35 PM      Result Value Ref Range   Sodium 141  137 - 147 mEq/L   Potassium 3.5 (*) 3.7 - 5.3 mEq/L   Chloride 105  96 - 112 mEq/L   CO2 23  19 - 32 mEq/L   Glucose, Bld 85  70 - 99 mg/dL   BUN 14  6 - 23 mg/dL   Creatinine, Ser 1.00  0.50 - 1.35 mg/dL   Calcium 9.3  8.4 - 10.5 mg/dL   Total Protein 7.0  6.0 - 8.3 g/dL   Albumin 4.0  3.5 - 5.2 g/dL   AST 16  0 - 37 U/L   ALT 17  0 - 53 U/L   Alkaline Phosphatase 112  39 - 117 U/L   Total Bilirubin 0.7  0.3 - 1.2 mg/dL    GFR calc non Af Amer >90  >90 mL/min   GFR calc Af Amer >90  >90 mL/min   Comment: (NOTE)     The eGFR has been calculated using the CKD EPI equation.     This calculation has not been validated in all clinical situations.     eGFR's persistently <90 mL/min signify possible Chronic Kidney     Disease.  ETHANOL     Status: None   Collection Time    12/18/13  6:35 PM      Result Value Ref Range   Alcohol, Ethyl (B) <11  0 - 11 mg/dL   Comment:            LOWEST DETECTABLE LIMIT FOR     SERUM ALCOHOL IS 11 mg/dL     FOR MEDICAL PURPOSES ONLY  URINE RAPID DRUG SCREEN (HOSP PERFORMED)     Status: None   Collection Time    12/18/13  6:50 PM      Result Value Ref Range   Opiates NONE DETECTED  NONE DETECTED   Cocaine NONE DETECTED  NONE DETECTED   Benzodiazepines NONE DETECTED  NONE DETECTED   Amphetamines NONE DETECTED  NONE DETECTED   Tetrahydrocannabinol NONE DETECTED  NONE DETECTED   Barbiturates NONE DETECTED  NONE DETECTED   Comment:            DRUG SCREEN FOR MEDICAL PURPOSES     ONLY.  IF CONFIRMATION IS NEEDED     FOR ANY PURPOSE, NOTIFY LAB     WITHIN 5 DAYS.                LOWEST DETECTABLE LIMITS     FOR URINE DRUG SCREEN     Drug Class       Cutoff (ng/mL)     Amphetamine      1000     Barbiturate      200     Benzodiazepine   048     Tricyclics       889     Opiates  300     Cocaine          300     THC              50    Pulse 88  Ht '6\' 2"'  (1.88 m)  Wt 255 lb (115.667 kg)  BMI 32.73 kg/m2   Review of Systems  Constitutional: Negative.   HENT: Negative.   Gastrointestinal: Negative for nausea, vomiting and diarrhea.  Skin: Negative.   Psychiatric/Behavioral: Positive for depression. The patient is nervous/anxious.     Mental Status Examination  Appearance: casual Alert: Yes Attention: fair  Cooperative: Yes Eye Contact: Fair Speech: coherent. Normal tone Psychomotor Activity: Decreased Memory/Concentration: fair Oriented: person,  place, time/date and situation Mood: Dysphoric Affect: Congruent Thought Processes and Associations: Linear Fund of Knowledge: Fair Thought Content: Suicidal ideation and Homicidal ideation were denied Insight: Fair Judgement: Fair  Diagnosis: Depressive disorder possible bipolar disorder type II current episode depressed. Intermittent explosive disorder per history  Treatment Plan:   Increase Abilify to 15 mg there is no involuntary movements noticeable. Cut down the Zoloft to 50 mg and take both medications that evening time other than during the day in order to have less fogginess.  Pertinent Labs and Relevant Prior Notes reviewed. Medication Side effects, benefits and risks reviewed/discussed with Patient. Time given for patient to respond and asks questions regarding the Diagnosis and Medications. Safety concerns and to report to ER if suicidal or call 911. Relevant Medications refilled or called in to pharmacy. Discussed weight maintenance and Sleep Hygiene. Follow up with Primary care provider in regards to Medical conditions. Recommend compliance with medications and follow up office appointments. Discussed to avail opportunity to consider or/and continue Individual therapy with Counselor. Greater than 50% of time was spend in counseling and coordination of care with the patient.  Schedule for Follow up visit in 3-4 weeks or call in earlier as necessary.  Merian Capron, MD 12/20/2013

## 2014-01-10 ENCOUNTER — Ambulatory Visit (INDEPENDENT_AMBULATORY_CARE_PROVIDER_SITE_OTHER): Payer: Medicare Other | Admitting: Psychiatry

## 2014-01-10 DIAGNOSIS — F3189 Other bipolar disorder: Secondary | ICD-10-CM

## 2014-01-10 DIAGNOSIS — F3181 Bipolar II disorder: Secondary | ICD-10-CM

## 2014-01-10 DIAGNOSIS — F6381 Intermittent explosive disorder: Secondary | ICD-10-CM

## 2014-01-10 MED ORDER — ARIPIPRAZOLE 10 MG PO TABS: 10.0000 mg | ORAL_TABLET | Freq: Every day | ORAL | Status: DC

## 2014-01-10 MED ORDER — FLUOXETINE HCL 10 MG PO CAPS: 10.0000 mg | ORAL_CAPSULE | Freq: Every day | ORAL | Status: DC

## 2014-01-10 NOTE — Progress Notes (Signed)
Patient ID: Tony [name], male   DOB: 10-Mar-1971, 43 y.o.   MRN: [id number] [name] Follow-up Outpatient Visit  Tony [name] [id number] 43 y.o.  01/10/2014  Chief Complaint: Depression     History of Present Illness:   Patient returns for Medication Follow up and is diagnosed with depressive disorder possible bipolar disorder type II.  Patient has been on Zoloft and Abilify apparently he stopped his medication 4 months ago for a while believes that he was doing better his recurrence of depression. He got more withdrawn with increased sleep decreased energy so he restarted back on his medications including Zoloft and Abilify. Apparently he continues to feel somewhat foggy but not hopeless or depressed does not endorse manic or psychotic symptoms there is no involuntary movements noticeable. He is taking the medications that evening time she recommended last time despite that he still feels somewhat foggy. He also endorses that in the past Prozac has been more helpful .   Duration of depression 2-4 years. Severity 5/10.  Aggravating factors include back condition and immobility he hurt his back while he was hunting deer hunt he built fell down on him. He also recently got a ticket for not having his seatbelt.  Modifying factors. Supportive wife and daughters.   Side effects possible some fogginess takes this medication in the morning.  He continues to use his CPAP machine for sleep apnea but endorses feeling foggy during the daytime and withdrawn from his usual activities. 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  Past Medical History  Diagnosis Date  . Sleep apnea     no cpcp yet  . H/O hiatal hernia   . Blood dyscrasia   . DDD (degenerative disc disease)   . GERD (gastroesophageal reflux disease)   . Depression    Family History  Problem Relation Age of Onset  . Cancer - Other Maternal Grandmother   .  Dementia Paternal Grandfather   . Cancer - Other Paternal Grandfather   . Anxiety disorder Neg Hx   . Depression Neg Hx   . Bipolar disorder Neg Hx     Outpatient Encounter Prescriptions as of 01/10/2014  Medication Sig  . ARIPiprazole (ABILIFY) 10 MG tablet Take 1 tablet (10 mg total) by mouth daily.  Marland Kitchen FLUoxetine (PROZAC) 10 MG capsule Take 1 capsule (10 mg total) by mouth daily.  . pantoprazole (PROTONIX) 40 MG tablet Take 1 tablet (40 mg total) by mouth daily.  . [DISCONTINUED] ARIPiprazole (ABILIFY) 10 MG tablet Take 1.5 tablets (15 mg total) by mouth daily.  . [DISCONTINUED] sertraline (ZOLOFT) 50 MG tablet Take 1 tablet (50 mg total) by mouth daily.    Recent Results (from the past 2160 hour(s))  CBC WITH DIFFERENTIAL     Status: None   Collection Time    12/18/13  6:35 PM      Result Value Ref Range   WBC 6.2  4.0 - 10.5 K/uL   RBC 5.26  4.22 - 5.81 MIL/uL   Hemoglobin 14.8  13.0 - 17.0 g/dL   HCT 43.1  39.0 - 52.0 %   MCV 81.9  78.0 - 100.0 fL   MCH 28.1  26.0 - 34.0 pg   MCHC 34.3  30.0 - 36.0 g/dL   RDW 15.4  11.5 - 15.5 %   Platelets 240  150 - 400 K/uL   Neutrophils Relative % 54  43 - 77 %  Neutro Abs 3.4  1.7 - 7.7 K/uL   Lymphocytes Relative 37  12 - 46 %   Lymphs Abs 2.3  0.7 - 4.0 K/uL   Monocytes Relative 8  3 - 12 %   Monocytes Absolute 0.5  0.1 - 1.0 K/uL   Eosinophils Relative 1  0 - 5 %   Eosinophils Absolute 0.1  0.0 - 0.7 K/uL   Basophils Relative 1  0 - 1 %   Basophils Absolute 0.0  0.0 - 0.1 K/uL  COMPREHENSIVE METABOLIC PANEL     Status: Abnormal   Collection Time    12/18/13  6:35 PM      Result Value Ref Range   Sodium 141  137 - 147 mEq/L   Potassium 3.5 (*) 3.7 - 5.3 mEq/L   Chloride 105  96 - 112 mEq/L   CO2 23  19 - 32 mEq/L   Glucose, Bld 85  70 - 99 mg/dL   BUN 14  6 - 23 mg/dL   Creatinine, Ser 1.00  0.50 - 1.35 mg/dL   Calcium 9.3  8.4 - 10.5 mg/dL   Total Protein 7.0  6.0 - 8.3 g/dL   Albumin 4.0  3.5 - 5.2 g/dL   AST 16  0  - 37 U/L   ALT 17  0 - 53 U/L   Alkaline Phosphatase 112  39 - 117 U/L   Total Bilirubin 0.7  0.3 - 1.2 mg/dL   GFR calc non Af Amer >90  >90 mL/min   GFR calc Af Amer >90  >90 mL/min   Comment: (NOTE)     The eGFR has been calculated using the CKD EPI equation.     This calculation has not been validated in all clinical situations.     eGFR's persistently <90 mL/min signify possible Chronic Kidney     Disease.  ETHANOL     Status: None   Collection Time    12/18/13  6:35 PM      Result Value Ref Range   Alcohol, Ethyl (B) <11  0 - 11 mg/dL   Comment:            LOWEST DETECTABLE LIMIT FOR     SERUM ALCOHOL IS 11 mg/dL     FOR MEDICAL PURPOSES ONLY  URINE RAPID DRUG SCREEN (HOSP PERFORMED)     Status: None   Collection Time    12/18/13  6:50 PM      Result Value Ref Range   Opiates NONE DETECTED  NONE DETECTED   Cocaine NONE DETECTED  NONE DETECTED   Benzodiazepines NONE DETECTED  NONE DETECTED   Amphetamines NONE DETECTED  NONE DETECTED   Tetrahydrocannabinol NONE DETECTED  NONE DETECTED   Barbiturates NONE DETECTED  NONE DETECTED   Comment:            DRUG SCREEN FOR MEDICAL PURPOSES     ONLY.  IF CONFIRMATION IS NEEDED     FOR ANY PURPOSE, NOTIFY LAB     WITHIN 5 DAYS.                LOWEST DETECTABLE LIMITS     FOR URINE DRUG SCREEN     Drug Class       Cutoff (ng/mL)     Amphetamine      1000     Barbiturate      200     Benzodiazepine   570     Tricyclics  300     Opiates          300     Cocaine          300     THC              50    There were no vitals taken for this visit.   Review of Systems  Constitutional: Negative.   HENT: Negative.   Gastrointestinal: Negative for nausea, vomiting and diarrhea.  Skin: Negative.   Psychiatric/Behavioral: Positive for depression. The patient is nervous/anxious.     Mental Status Examination  Appearance: casual Alert: Yes Attention: fair  Cooperative: Yes Eye Contact: Fair Speech: coherent. Normal  tone Psychomotor Activity: Decreased Memory/Concentration: fair Oriented: person, place, time/date and situation Mood: Dysphoric Affect: Congruent Thought Processes and Associations: Linear Fund of Knowledge: Fair Thought Content: Suicidal ideation and Homicidal ideation were denied Insight: Fair Judgement: Fair  Diagnosis: Depressive disorder possible bipolar disorder type II current episode depressed. Intermittent explosive disorder per history  Treatment Plan:   We will discontinue Zoloft. Considering his fogginess may or may not be related with Abilify reluctant down the Abilify to 10 mg instead of 50 mg. Restarted back on Prozac that he has felt comfortable in the past for depression 10 mg. No impulsive or explosive ideas. Follow up in 4 weeks  Pertinent Labs and Relevant Prior Notes reviewed. Medication Side effects, benefits and risks reviewed/discussed with Patient. Time given for patient to respond and asks questions regarding the Diagnosis and Medications. Safety concerns and to report to ER if suicidal or call 911. Relevant Medications refilled or called in to pharmacy. Discussed weight maintenance and Sleep Hygiene. Follow up with Primary care provider in regards to Medical conditions. Recommend compliance with medications and follow up office appointments. Discussed to avail opportunity to consider or/and continue Individual therapy with Counselor. Greater than 50% of time was spend in counseling and coordination of care with the patient.  Schedule for Follow up visit in 3-4 weeks or call in earlier as necessary.  Merian Capron, MD 01/10/2014

## 2014-02-09 ENCOUNTER — Ambulatory Visit (HOSPITAL_COMMUNITY): Payer: Self-pay | Admitting: Psychiatry

## 2014-02-09 ENCOUNTER — Ambulatory Visit (INDEPENDENT_AMBULATORY_CARE_PROVIDER_SITE_OTHER): Payer: Medicare Other | Admitting: Psychiatry

## 2014-02-09 ENCOUNTER — Encounter (INDEPENDENT_AMBULATORY_CARE_PROVIDER_SITE_OTHER): Payer: Self-pay

## 2014-02-09 DIAGNOSIS — F3289 Other specified depressive episodes: Secondary | ICD-10-CM

## 2014-02-09 DIAGNOSIS — F3181 Bipolar II disorder: Secondary | ICD-10-CM

## 2014-02-09 DIAGNOSIS — F6381 Intermittent explosive disorder: Secondary | ICD-10-CM

## 2014-02-09 DIAGNOSIS — F329 Major depressive disorder, single episode, unspecified: Secondary | ICD-10-CM

## 2014-02-09 MED ORDER — ARIPIPRAZOLE 10 MG PO TABS: 10.0000 mg | ORAL_TABLET | Freq: Every day | ORAL | Status: DC

## 2014-02-09 MED ORDER — FLUOXETINE HCL 20 MG PO CAPS: 20.0000 mg | ORAL_CAPSULE | Freq: Every day | ORAL | Status: DC

## 2014-02-09 NOTE — Patient Instructions (Signed)
INCREASE PROZAC 20MG . CONTINUE ABILIFY 10MG .

## 2014-02-09 NOTE — Progress Notes (Signed)
Patient ID: Tony [name], male   DOB: 05/09/1971, 43 y.o.   MRN: [id number] [place] Follow-up Outpatient Visit  MARCELIS WISSNER [id number] 43 y.o.  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  Patient was feeling foggy on Zoloft which recently discontinued Zoloft last visit we started on Prozac 10 mg and continued Abilify. Currently is having crying spells he went to the beach and his wife is here today and mentions that he tries a small things and a feeling down and withdrawn. 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 He does have spinal stenosis and have back issues. He cries or the daughter how she could do in school. Apparently he told that he did not continue Abilify and was just taking the Prozac. His wife got upset because he was not taking the medication as prescribed and she did not even know that he cannot handle the c   Duration of depression 2-4 years. Severity 5/10.  Aggravating factors include back condition and immobility he hurt his back while he was hunting deer hunt he built fell down on him. He also recently got a ticket for not having his seatbelt.  Modifying factors. Supportive wife and daughters.   Crying spells, anhedonia and excessive sleep are the associated symptoms. No aggression recently. In the past he has had intermittent explosive behavior but nothing recent  He continues to use his CPAP machine for sleep apnea but endorses feeling foggy during the daytime and withdrawn from his usual activities. 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  Past Medical History  Diagnosis Date  . Sleep apnea     no cpcp yet  . H/O hiatal hernia   . Blood dyscrasia   . DDD (degenerative disc disease)   . GERD (gastroesophageal reflux  disease)   . Depression    Family History  Problem Relation Age of Onset  . Cancer - Other Maternal Grandmother   . Dementia Paternal Grandfather   . Cancer - Other Paternal Grandfather   . Anxiety disorder Neg Hx   . Depression Neg Hx   . Bipolar disorder Neg Hx     Outpatient Encounter Prescriptions as of 02/09/2014  Medication Sig  . ARIPiprazole (ABILIFY) 10 MG tablet Take 1 tablet (10 mg total) by mouth daily.  Marland Kitchen FLUoxetine (PROZAC) 20 MG capsule Take 1 capsule (20 mg total) by mouth daily.  . pantoprazole (PROTONIX) 40 MG tablet Take 1 tablet (40 mg total) by mouth daily.  . [DISCONTINUED] ARIPiprazole (ABILIFY) 10 MG tablet Take 1 tablet (10 mg total) by mouth daily.  . [DISCONTINUED] FLUoxetine (PROZAC) 10 MG capsule Take 1 capsule (10 mg total) by mouth daily.    Recent Results (from the past 2160 hour(s))  CBC WITH DIFFERENTIAL     Status: None   Collection Time    12/18/13  6:35 PM      Result Value Ref Range   WBC 6.2  4.0 - 10.5 K/uL   RBC 5.26  4.22 - 5.81 MIL/uL   Hemoglobin 14.8  13.0 - 17.0 g/dL   HCT 43.1  39.0 - 52.0 %   MCV 81.9  78.0 - 100.0 fL   MCH 28.1  26.0 - 34.0 pg   MCHC 34.3  30.0 - 36.0 g/dL  RDW 15.4  11.5 - 15.5 %   Platelets 240  150 - 400 K/uL   Neutrophils Relative % 54  43 - 77 %   Neutro Abs 3.4  1.7 - 7.7 K/uL   Lymphocytes Relative 37  12 - 46 %   Lymphs Abs 2.3  0.7 - 4.0 K/uL   Monocytes Relative 8  3 - 12 %   Monocytes Absolute 0.5  0.1 - 1.0 K/uL   Eosinophils Relative 1  0 - 5 %   Eosinophils Absolute 0.1  0.0 - 0.7 K/uL   Basophils Relative 1  0 - 1 %   Basophils Absolute 0.0  0.0 - 0.1 K/uL  COMPREHENSIVE METABOLIC PANEL     Status: Abnormal   Collection Time    12/18/13  6:35 PM      Result Value Ref Range   Sodium 141  137 - 147 mEq/L   Potassium 3.5 (*) 3.7 - 5.3 mEq/L   Chloride 105  96 - 112 mEq/L   CO2 23  19 - 32 mEq/L   Glucose, Bld 85  70 - 99 mg/dL   BUN 14  6 - 23 mg/dL   Creatinine, Ser 1.00  0.50 - 1.35  mg/dL   Calcium 9.3  8.4 - 10.5 mg/dL   Total Protein 7.0  6.0 - 8.3 g/dL   Albumin 4.0  3.5 - 5.2 g/dL   AST 16  0 - 37 U/L   ALT 17  0 - 53 U/L   Alkaline Phosphatase 112  39 - 117 U/L   Total Bilirubin 0.7  0.3 - 1.2 mg/dL   GFR calc non Af Amer >90  >90 mL/min   GFR calc Af Amer >90  >90 mL/min   Comment: (NOTE)     The eGFR has been calculated using the CKD EPI equation.     This calculation has not been validated in all clinical situations.     eGFR's persistently <90 mL/min signify possible Chronic Kidney     Disease.  ETHANOL     Status: None   Collection Time    12/18/13  6:35 PM      Result Value Ref Range   Alcohol, Ethyl (B) <11  0 - 11 mg/dL   Comment:            LOWEST DETECTABLE LIMIT FOR     SERUM ALCOHOL IS 11 mg/dL     FOR MEDICAL PURPOSES ONLY  URINE RAPID DRUG SCREEN (HOSP PERFORMED)     Status: None   Collection Time    12/18/13  6:50 PM      Result Value Ref Range   Opiates NONE DETECTED  NONE DETECTED   Cocaine NONE DETECTED  NONE DETECTED   Benzodiazepines NONE DETECTED  NONE DETECTED   Amphetamines NONE DETECTED  NONE DETECTED   Tetrahydrocannabinol NONE DETECTED  NONE DETECTED   Barbiturates NONE DETECTED  NONE DETECTED   Comment:            DRUG SCREEN FOR MEDICAL PURPOSES     ONLY.  IF CONFIRMATION IS NEEDED     FOR ANY PURPOSE, NOTIFY LAB     WITHIN 5 DAYS.                LOWEST DETECTABLE LIMITS     FOR URINE DRUG SCREEN     Drug Class       Cutoff (ng/mL)     Amphetamine      1000  Barbiturate      200     Benzodiazepine   132     Tricyclics       440     Opiates          300     Cocaine          300     THC              50    There were no vitals taken for this visit.   Review of Systems  Constitutional: Negative.   HENT: Negative.   Gastrointestinal: Negative for nausea, vomiting and diarrhea.  Skin: Negative.   Psychiatric/Behavioral: Positive for depression. Negative for suicidal ideas, hallucinations and substance  abuse. The patient is nervous/anxious.     Mental Status Examination  Appearance: casual Alert: Yes Attention: fair  Cooperative: Yes Eye Contact: Fair Speech: coherent. Normal tone Psychomotor Activity: Decreased Memory/Concentration: fair Oriented: person, place, time/date and situation Mood: Dysphoric Affect: Congruent Thought Processes and Associations: Linear Fund of Knowledge: Fair Thought Content: Suicidal ideation and Homicidal ideation were denied Insight: Fair Judgement: Fair  Diagnosis: Depressive disorder possible bipolar disorder type II current episode depressed. Intermittent explosive disorder per history  Treatment Plan:   Reinstate Abilify 10 mg. Increase Prozac to 20 mg because of depression. I do recommend therapy. Wife and handle his medication so he is compliant. He endorses no thoughts of suicide  Pertinent Labs and Relevant Prior Notes reviewed. Medication Side effects, benefits and risks reviewed/discussed with Patient. Time given for patient to respond and asks questions regarding the Diagnosis and Medications. Safety concerns and to report to ER if suicidal or call 911. Relevant Medications refilled or called in to pharmacy. Discussed weight maintenance and Sleep Hygiene. Follow up with Primary care provider in regards to Medical conditions. Recommend compliance with medications and follow up office appointments. Discussed to avail opportunity to consider or/and continue Individual therapy with Counselor. Greater than 50% of time was spend in counseling and coordination of care with the patient.  Schedule for Follow up visit in 3-4 weeks or call in earlier as necessary.  Merian Capron, MD 02/09/2014

## 2014-03-08 ENCOUNTER — Encounter (HOSPITAL_COMMUNITY): Payer: Self-pay | Admitting: Psychiatry

## 2014-03-08 ENCOUNTER — Ambulatory Visit (INDEPENDENT_AMBULATORY_CARE_PROVIDER_SITE_OTHER): Payer: Medicare Other | Admitting: Psychiatry

## 2014-03-08 ENCOUNTER — Encounter (INDEPENDENT_AMBULATORY_CARE_PROVIDER_SITE_OTHER): Payer: Self-pay

## 2014-03-08 VITALS — BP 127/67 | HR 60 | Ht 75.0 in | Wt 255.0 lb

## 2014-03-08 DIAGNOSIS — F3181 Bipolar II disorder: Secondary | ICD-10-CM

## 2014-03-08 DIAGNOSIS — F6381 Intermittent explosive disorder: Secondary | ICD-10-CM

## 2014-03-08 DIAGNOSIS — F3289 Other specified depressive episodes: Secondary | ICD-10-CM

## 2014-03-08 DIAGNOSIS — F329 Major depressive disorder, single episode, unspecified: Secondary | ICD-10-CM

## 2014-03-08 MED ORDER — ARIPIPRAZOLE 10 MG PO TABS: 10.0000 mg | ORAL_TABLET | Freq: Every day | ORAL | Status: DC

## 2014-03-08 MED ORDER — FLUOXETINE HCL 20 MG PO CAPS: 20.0000 mg | ORAL_CAPSULE | Freq: Every day | ORAL | Status: DC

## 2014-03-08 NOTE — Progress Notes (Signed)
Patient ID: Tony [name], male   DOB: [birth year], 43 y.o.   MRN: [id number] [name] Follow-up Outpatient Visit  Tony [name] [id number] 43 y.o.  03/08/2014  Chief Complaint: Depression     History of Present Illness:   Patient returns for Medication Follow up and is diagnosed with depressive disorder possible bipolar disorder type II.  Last visit we restarted Abilify. Apparently was not taking Abilify and was in emotional depressed crying spells. He went to the beach and was having crying spells. His wife is a last visit we talked that he was taking Abilify but he was not. He is now taking Prozac and Abilify. There is no involuntary movements. AI MS test is 0  Depression improved now severity as 7/10. He is trying to keep a so busy but he is having some struggles with tiredness during the day. I advised him to have walks in more physical activity during the day. There's no hopelessness or helplessness. No manic symptoms or psychotic symptoms   Duration of depression 2-4 years. Severity 7/10.  Aggravating factors include back condition and immobility he hurt his back while he was hunting deer hunt he built fell down on him. He also recently got a ticket for not having his seatbelt.  Modifying factors. Supportive wife and daughters.   Crying spells, anhedonia and excessive sleep are the associated symptoms. No aggression recently. In the past he has had intermittent explosive behavior but nothing recent  He continues to use his CPAP machine for sleep apnea but endorses feeling foggy during the daytime and withdrawn from his usual activities. 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  Past Medical History  Diagnosis Date  . Sleep apnea     no cpcp yet  . H/O hiatal hernia   . Blood dyscrasia   . DDD (degenerative disc disease)   . GERD (gastroesophageal reflux disease)   . Depression    Family History   Problem Relation Age of Onset  . Cancer - Other Maternal Grandmother   . Dementia Paternal Grandfather   . Cancer - Other Paternal Grandfather   . Anxiety disorder Neg Hx   . Depression Neg Hx   . Bipolar disorder Neg Hx     Outpatient Encounter Prescriptions as of 03/08/2014  Medication Sig  . ARIPiprazole (ABILIFY) 10 MG tablet Take 1 tablet (10 mg total) by mouth daily.  Marland Kitchen FLUoxetine (PROZAC) 20 MG capsule Take 1 capsule (20 mg total) by mouth daily.  . pantoprazole (PROTONIX) 40 MG tablet Take 1 tablet (40 mg total) by mouth daily.  . [DISCONTINUED] ARIPiprazole (ABILIFY) 10 MG tablet Take 1 tablet (10 mg total) by mouth daily.  . [DISCONTINUED] FLUoxetine (PROZAC) 20 MG capsule Take 1 capsule (20 mg total) by mouth daily.    Recent Results (from the past 2160 hour(s))  CBC WITH DIFFERENTIAL     Status: None   Collection Time    12/18/13  6:35 PM      Result Value Ref Range   WBC 6.2  4.0 - 10.5 K/uL   RBC 5.26  4.22 - 5.81 MIL/uL   Hemoglobin 14.8  13.0 - 17.0 g/dL   HCT 43.1  39.0 - 52.0 %   MCV 81.9  78.0 - 100.0 fL   MCH 28.1  26.0 - 34.0 pg   MCHC 34.3  30.0 - 36.0 g/dL   RDW 15.4  11.5 - 15.5 %  Platelets 240  150 - 400 K/uL   Neutrophils Relative % 54  43 - 77 %   Neutro Abs 3.4  1.7 - 7.7 K/uL   Lymphocytes Relative 37  12 - 46 %   Lymphs Abs 2.3  0.7 - 4.0 K/uL   Monocytes Relative 8  3 - 12 %   Monocytes Absolute 0.5  0.1 - 1.0 K/uL   Eosinophils Relative 1  0 - 5 %   Eosinophils Absolute 0.1  0.0 - 0.7 K/uL   Basophils Relative 1  0 - 1 %   Basophils Absolute 0.0  0.0 - 0.1 K/uL  COMPREHENSIVE METABOLIC PANEL     Status: Abnormal   Collection Time    12/18/13  6:35 PM      Result Value Ref Range   Sodium 141  137 - 147 mEq/L   Potassium 3.5 (*) 3.7 - 5.3 mEq/L   Chloride 105  96 - 112 mEq/L   CO2 23  19 - 32 mEq/L   Glucose, Bld 85  70 - 99 mg/dL   BUN 14  6 - 23 mg/dL   Creatinine, Ser 1.00  0.50 - 1.35 mg/dL   Calcium 9.3  8.4 - 10.5 mg/dL    Total Protein 7.0  6.0 - 8.3 g/dL   Albumin 4.0  3.5 - 5.2 g/dL   AST 16  0 - 37 U/L   ALT 17  0 - 53 U/L   Alkaline Phosphatase 112  39 - 117 U/L   Total Bilirubin 0.7  0.3 - 1.2 mg/dL   GFR calc non Af Amer >90  >90 mL/min   GFR calc Af Amer >90  >90 mL/min   Comment: (NOTE)     The eGFR has been calculated using the CKD EPI equation.     This calculation has not been validated in all clinical situations.     eGFR's persistently <90 mL/min signify possible Chronic Kidney     Disease.  ETHANOL     Status: None   Collection Time    12/18/13  6:35 PM      Result Value Ref Range   Alcohol, Ethyl (B) <11  0 - 11 mg/dL   Comment:            LOWEST DETECTABLE LIMIT FOR     SERUM ALCOHOL IS 11 mg/dL     FOR MEDICAL PURPOSES ONLY  URINE RAPID DRUG SCREEN (HOSP PERFORMED)     Status: None   Collection Time    12/18/13  6:50 PM      Result Value Ref Range   Opiates NONE DETECTED  NONE DETECTED   Cocaine NONE DETECTED  NONE DETECTED   Benzodiazepines NONE DETECTED  NONE DETECTED   Amphetamines NONE DETECTED  NONE DETECTED   Tetrahydrocannabinol NONE DETECTED  NONE DETECTED   Barbiturates NONE DETECTED  NONE DETECTED   Comment:            DRUG SCREEN FOR MEDICAL PURPOSES     ONLY.  IF CONFIRMATION IS NEEDED     FOR ANY PURPOSE, NOTIFY LAB     WITHIN 5 DAYS.                LOWEST DETECTABLE LIMITS     FOR URINE DRUG SCREEN     Drug Class       Cutoff (ng/mL)     Amphetamine      1000     Barbiturate  200     Benzodiazepine   350     Tricyclics       757     Opiates          300     Cocaine          300     THC              50    BP 127/67  Pulse 60  Ht '6\' 3"'  (1.905 m)  Wt 255 lb (115.667 kg)  BMI 31.87 kg/m2   Review of Systems  Constitutional: Negative.   Gastrointestinal: Negative for nausea and diarrhea.  Neurological: Negative for dizziness, tremors and headaches.  Psychiatric/Behavioral: Positive for depression. Negative for suicidal ideas, hallucinations and  substance abuse. The patient is nervous/anxious. The patient does not have insomnia.     Mental Status Examination  Appearance: casual Alert: Yes Attention: fair  Cooperative: Yes Eye Contact: Fair Speech: coherent. Normal tone Psychomotor Activity: Decreased Memory/Concentration: fair Oriented: person, place, time/date and situation Mood: Dysphoric Affect: Congruent Thought Processes and Associations: Linear Fund of Knowledge: Fair Thought Content: Suicidal ideation and Homicidal ideation were denied Insight: Fair Judgement: Fair  Diagnosis: Depressive disorder possible bipolar disorder type II current episode depressed. Intermittent explosive disorder per history  Treatment Plan:   Continue Abilify 10 mg. Continue Prozac 20 mg. Otherwise to increase his physical activity and to avoid excessive sleeping during the day. Mood is improved for follow up in 2 months  Pertinent Labs and Relevant Prior Notes reviewed. Medication Side effects, benefits and risks reviewed/discussed with Patient. Time given for patient to respond and asks questions regarding the Diagnosis and Medications. Safety concerns and to report to ER if suicidal or call 911. Relevant Medications refilled or called in to pharmacy. Discussed weight maintenance and Sleep Hygiene. Follow up with Primary care provider in regards to Medical conditions. Recommend compliance with medications and follow up office appointments. Discussed to avail opportunity to consider or/and continue Individual therapy with Counselor. Greater than 50% of time was spend in counseling and coordination of care with the patient.  Schedule for Follow up visit in 8 weeks or call in earlier as necessary.  Merian Capron, MD 03/08/2014

## 2014-05-08 ENCOUNTER — Encounter (HOSPITAL_COMMUNITY): Payer: Self-pay | Admitting: Psychiatry

## 2014-05-08 ENCOUNTER — Ambulatory Visit (INDEPENDENT_AMBULATORY_CARE_PROVIDER_SITE_OTHER): Payer: Medicare Other | Admitting: Psychiatry

## 2014-05-08 VITALS — BP 117/70 | HR 80 | Ht 75.0 in | Wt 254.0 lb

## 2014-05-08 DIAGNOSIS — F3181 Bipolar II disorder: Secondary | ICD-10-CM

## 2014-05-08 DIAGNOSIS — F6381 Intermittent explosive disorder: Secondary | ICD-10-CM

## 2014-05-08 DIAGNOSIS — F329 Major depressive disorder, single episode, unspecified: Secondary | ICD-10-CM

## 2014-05-08 MED ORDER — ARIPIPRAZOLE 20 MG PO TABS: 20.0000 mg | ORAL_TABLET | Freq: Every day | ORAL | Status: DC

## 2014-05-08 MED ORDER — FLUOXETINE HCL 20 MG PO CAPS: 20.0000 mg | ORAL_CAPSULE | Freq: Every day | ORAL | Status: DC

## 2014-05-08 NOTE — Progress Notes (Signed)
Patient ID: Tony [name] Moscato, male   DOB: June 17, 1971, 43 y.o.   [facility] [id number] [facility]  Tony [name] Golinski [id number] 43 y.o.  05/08/2014  Chief Complaint: Depression     History of Present Illness:   Patient returns for Medication Follow up and is diagnosed with depressive disorder possible bipolar disorder type II.   He is here with his wife and incident happened a few days ago he got mad with a family member. They got worried of what he will do. Now he regrets that. Otherwise does not get mad on a regular basis. Mostly stays at home not driving too much, feels sleep is more and tired feeling. Increase weight may have caused his apnea to got worse. He still uses cpap but not during the daytime naps. Wife concern about safety since he has access to guns so I made sure that they should be no access to guns and he and the wife has agreed to that.  There is no involuntary movements. AI MS test is 0  Depression improved now severity as 6/10. He is trying to keep a so busy but he is having some struggles with tiredness during the day. I advised him to have walks in more physical activity during the day. There's no hopelessness or helplessness. No manic symptoms or psychotic symptoms Does not endorse hallucinations or manic symptoms he remains tired and withdrawn. He motivated because of not having a job but also does not do much during the daytime and excuses out of some household chores  Duration of depression 2-4 years. Severity 6/10.  Aggravating factors include back condition and immobility he hurt his back while he was hunting deer hunt he built fell down on him. He also recently got a ticket for not having his seatbelt.  Modifying factors. Supportive wife and daughters.   Crying spells, anhedonia and excessive sleep are the associated symptoms. No aggression recently. 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  Past Medical History  Diagnosis Date  . Sleep apnea     no cpcp yet  . H/O hiatal hernia   . Blood dyscrasia   . DDD (degenerative disc disease)   . GERD (gastroesophageal reflux disease)   . Depression    Family History  Problem Relation Age of Onset  . Cancer - Other Maternal Grandmother   . Dementia Paternal Grandfather   . Cancer - Other Paternal Grandfather   . Anxiety disorder Neg Hx   . Depression Neg Hx   . Bipolar disorder Neg Hx     Outpatient Encounter Prescriptions as of 05/08/2014  Medication Sig  . ARIPiprazole (ABILIFY) 20 MG tablet Take 1 tablet (20 mg total) by mouth daily.  Marland Kitchen. FLUoxetine (PROZAC) 20 MG capsule Take 1 capsule (20 mg total) by mouth daily.  . pantoprazole (PROTONIX) 40 MG tablet Take 1 tablet (40 mg total) by mouth daily.  . [DISCONTINUED] ARIPiprazole (ABILIFY) 10 MG tablet Take 1 tablet (10 mg total) by mouth daily.  . [DISCONTINUED] FLUoxetine (PROZAC) 20 MG capsule Take 1 capsule (20 mg total) by mouth daily.    No results found for this or any previous visit (from the past 2160 hour(s)).  BP 117/70 mmHg  Pulse 80  Ht 6\' 3"  (1.905 m)  Wt 254 lb (115.214 kg)  BMI 31.75 kg/m2  Review of Systems  Constitutional: Negative for fever.  Gastrointestinal: Negative for nausea and vomiting.  Musculoskeletal: Negative for myalgias.  Neurological: Negative for tremors and headaches.  Psychiatric/Behavioral: Positive for depression. Negative for suicidal ideas and substance abuse. The patient is nervous/anxious. The patient does not have insomnia.     Mental Status Examination  Appearance: casual Alert: Yes Attention: fair  Cooperative: Yes Eye Contact: Fair Speech: coherent. Normal tone Psychomotor Activity: Decreased Memory/Concentration: fair Oriented: person, place, time/date and situation Mood: Dysphoric Affect: Congruent Thought  Processes and Associations: Linear Fund of Knowledge: Fair Thought Content: Suicidal ideation and Homicidal ideation were denied Insight: Fair Judgement: Fair  Diagnosis: Depressive disorder possible bipolar disorder type II current episode depressed. Intermittent explosive disorder per history  Treatment Plan:   Increase abilify 20mg .  Continue Prozac 20 mg. Otherwise to increase his physical activity and to avoid excessive sleeping during the day. Re assess sleep apnea as he has gained weight.  No guns or access to guns at home. He agrees.  Do recommend therapy and more frequent visits for now.   Pertinent Labs and Relevant Prior Notes reviewed. Medication Side effects, benefits and risks reviewed/discussed with Patient. Time given for patient to respond and asks questions regarding the Diagnosis and Medications. Safety concerns and to report to ER if suicidal or call 911. Relevant Medications refilled or called in to pharmacy. Discussed weight maintenance and Sleep Hygiene. Follow up with Primary care provider in regards to Medical conditions. Recommend compliance with medications and follow up office appointments. Discussed to avail opportunity to consider or/and continue Individual therapy with Counselor. Greater than 50% of time was spend in counseling and coordination of care with the patient.  Schedule for Follow up visit in 3 weeks or call in earlier as necessary.  Thresa RossAKHTAR, Latrina Guttman, MD 05/08/2014

## 2014-05-29 ENCOUNTER — Encounter (INDEPENDENT_AMBULATORY_CARE_PROVIDER_SITE_OTHER): Payer: Self-pay

## 2014-05-29 ENCOUNTER — Ambulatory Visit (INDEPENDENT_AMBULATORY_CARE_PROVIDER_SITE_OTHER): Payer: Medicare Other | Admitting: Licensed Clinical Social Worker

## 2014-05-29 ENCOUNTER — Encounter (HOSPITAL_COMMUNITY): Payer: Self-pay | Admitting: Licensed Clinical Social Worker

## 2014-05-29 DIAGNOSIS — F6381 Intermittent explosive disorder: Secondary | ICD-10-CM
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
## 2014-06-12 ENCOUNTER — Ambulatory Visit (INDEPENDENT_AMBULATORY_CARE_PROVIDER_SITE_OTHER): Payer: Medicare Other | Admitting: Psychiatry

## 2014-06-12 ENCOUNTER — Encounter (HOSPITAL_COMMUNITY): Payer: Self-pay | Admitting: Psychiatry

## 2014-06-12 ENCOUNTER — Ambulatory Visit (HOSPITAL_COMMUNITY): Payer: Self-pay | Admitting: Psychiatry

## 2014-06-12 VITALS — BP 125/74 | HR 70 | Ht 75.0 in | Wt 256.0 lb

## 2014-06-12 DIAGNOSIS — F329 Major depressive disorder, single episode, unspecified: Secondary | ICD-10-CM

## 2014-06-12 DIAGNOSIS — F6381 Intermittent explosive disorder: Secondary | ICD-10-CM

## 2014-06-12 MED ORDER — FLUOXETINE HCL 20 MG PO CAPS: 20.0000 mg | ORAL_CAPSULE | Freq: Every day | ORAL | Status: DC

## 2014-06-12 MED ORDER — ARIPIPRAZOLE 30 MG PO TABS: 30.0000 mg | ORAL_TABLET | Freq: Every day | ORAL | Status: DC
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
## 2014-06-14 ENCOUNTER — Ambulatory Visit (INDEPENDENT_AMBULATORY_CARE_PROVIDER_SITE_OTHER): Payer: Medicare Other | Admitting: Licensed Clinical Social Worker

## 2014-06-14 DIAGNOSIS — F6381 Intermittent explosive disorder: Secondary | ICD-10-CM

## 2014-06-14 NOTE — Psych (Signed)
   THERAPIST PROGRESS NOTE  Session Time: 11:05am-12:05pm  Participation Level: Active  Behavioral Response: DisheveledAlertEuthymic  Type of Therapy: Individual Therapy  Treatment Goals addressed: Anger  Interventions: CBT  Therapist Response:  Gathered information about recent stressors and how he has been coping.  Learned more about family dynamics and struggles of being in blended family.  Reviewed different aspects of life that can be affected in a negative way when anger is not managed effectively.  These included work, family relationships, marital relationships, friendships, and health.    Summary: Argus reported that he has been feeling a bit better and he attributes it in part to working more often.  He noted that he has been helping out more around the house.  He described how it is challenging being a parent to teenage step-daughters and a younger daughter who wants to be included.  He admitted that he sometimes feels like he can't intervene when the older girls act up.  He was able to identify one anger trigger today: being around a lot of drama or chaos.  He was also able to identify many of the costs of handling anger inappropriately in different areas of his life. Denies any incidents of losing his temper in the past two weeks.  Suicidal/Homicidal: Nowithout intent/plan    Plan: Return again in two weeks.  Will go over short-term payoffs anger can provide  Diagnosis: Intermittent Explosive Disorder     Darrin LuisSolomon, Sarah A, LCSW 06/14/2014

## 2014-06-28 ENCOUNTER — Ambulatory Visit (INDEPENDENT_AMBULATORY_CARE_PROVIDER_SITE_OTHER): Payer: Medicare Other | Admitting: Licensed Clinical Social Worker

## 2014-06-28 DIAGNOSIS — F6381 Intermittent explosive disorder: Secondary | ICD-10-CM

## 2014-06-28 NOTE — Psych (Signed)
   THERAPIST PROGRESS NOTE  Session Time: 11:05am-12:00pm  Participation Level: Active  Behavioral Response: Fairly groomed, Alert, Euthymic  Type of Therapy: Individual Therapy  Treatment Goals addressed: Anger  Interventions: CBT  Therapist Response:  Reviewed some of the short term benefits of expressing anger.  Also identified negative long-term consequences.  Explained how anger develops when painful feelings are combined with trigger thoughts.  Showed patient how to use an Anger Log to become more aware of anger triggers and outcomes.  Asked him to complete some entries for homework.    Summary: Tony [name] indicated he could relate to many of the anger payoffs discussed.  He was able to fill out a personal example for an anger log entry.    He agreed to do the homework.   He reported that he did not have any major anger outbursts, but he did admit to one incident of regretting how he managed a frustrating situation.  He overreacted when his daughter was acting hyperactive.   Tony [name] expressed a belief that his medication is helping him to feel calmer.      Suicidal/Homicidal: Nowithout intent/plan    Plan: Return again in one to two weeks.  Will review Anger Log entries.      Diagnosis: Intermittent Explosive Disorder     Darrin LuisSolomon, Sarah A, LCSW 06/28/2014

## 2014-07-05 ENCOUNTER — Ambulatory Visit (HOSPITAL_COMMUNITY): Payer: Self-pay | Admitting: Licensed Clinical Social Worker

## 2014-07-12 ENCOUNTER — Ambulatory Visit (HOSPITAL_COMMUNITY): Payer: Self-pay | Admitting: Licensed Clinical Social Worker

## 2014-07-12 ENCOUNTER — Ambulatory Visit (HOSPITAL_COMMUNITY): Payer: Self-pay | Admitting: Psychiatry

## 2014-07-19 ENCOUNTER — Ambulatory Visit (HOSPITAL_COMMUNITY): Payer: Self-pay | Admitting: Licensed Clinical Social Worker

## 2014-07-26 ENCOUNTER — Encounter (INDEPENDENT_AMBULATORY_CARE_PROVIDER_SITE_OTHER): Payer: Self-pay

## 2014-07-26 ENCOUNTER — Ambulatory Visit (INDEPENDENT_AMBULATORY_CARE_PROVIDER_SITE_OTHER): Payer: Medicare Other | Admitting: Licensed Clinical Social Worker

## 2014-07-26 DIAGNOSIS — F6381 Intermittent explosive disorder: Secondary | ICD-10-CM

## 2014-07-26 NOTE — Psych (Signed)
   THERAPIST PROGRESS NOTE  Session Time: 11:00am-11:50am  Participation Level: Active  Behavioral Response: Fairly groomed, Alert, Euthymic  Type of Therapy: Individual Therapy  Treatment Goals addressed: Anger  Interventions: CBT  Therapist Response:  Asked if patient had used the Anger Log.  Taught several relaxation skills: progressive muscle relaxation, deep breathing, and imagery.  Discussed other methods for relaxation as well.  Encouraged setting aside time specifically for relaxation as well as using relaxation in the moment during times when he catches himself getting stressed.    Summary:  Reported he did record some entries onto an Anger Log.  Was able to describe one incident that worked out well where he caught himself getting worked up and suggested taking a time out to his daughter before dealing with the problem at hand.   Cooperative about practicing the relaxation exercises.  Indicated that he plans to incorporate some of the skills into his daily routine. Did not report any instances of losing his temper.  Reports mood has been good. Reports he has been staying busy with work.      Suicidal/Homicidal: Nowithout intent/plan    Plan: Return again in one to two weeks.  Suggested having his wife join the next session.    Diagnosis: Intermittent Explosive Disorder     Darrin LuisSolomon, Sarah A, LCSW 07/26/2014

## 2014-08-02 ENCOUNTER — Encounter (INDEPENDENT_AMBULATORY_CARE_PROVIDER_SITE_OTHER): Payer: Self-pay

## 2014-08-02 ENCOUNTER — Ambulatory Visit (INDEPENDENT_AMBULATORY_CARE_PROVIDER_SITE_OTHER): Payer: Medicare Other | Admitting: Licensed Clinical Social Worker

## 2014-08-02 DIAGNOSIS — F6381 Intermittent explosive disorder: Secondary | ICD-10-CM | POA: Diagnosis not present

## 2014-08-02 DIAGNOSIS — F3181 Bipolar II disorder: Secondary | ICD-10-CM

## 2014-08-03 NOTE — Psych (Signed)
THERAPIST PROGRESS NOTE  Session Time: 11:00am-12:00pm  Participation Level: Active  Behavioral Response: Fairly groomed, Alert, Euthymic  Type of Therapy: Individual Therapy  Treatment Goals addressed: Anger  Interventions: CBT  Therapist Response:  Met with patient and his wife.  Asked wife to note changes in mood and behavior she has noticed in patient since she last joined a therapy session in December.  Discussed how she continues to struggle with being able to trust him.   Discussed how patient lacks a regular daily routine and how he can start to increase activity as a way to cope with depression.   Summary:  Wife denied patient having any anger outbursts.  Despite this she says she is cautious about trusting him to take care of the kids when she is not present.  She acknowledged distance in their relationship.  Patient agreed with this saying he often feels like he is "walking on eggshells." Patient has been sleeping a lot and hasn't been motivated to do much.  Admits to engaging in a lot of negative thinking related to the fact that he is not able to work and provide for his family.  Wife is frustrated by his lack of initiative to help around the house.                 Suicidal/Homicidal: Nowithout intent/plan    Plan: Return again in one to two weeks.  Will focus on recognizing and changing negative thinking patterns.   Diagnosis: Intermittent Explosive Disorder                                                   Bipolar II Disorder    ,  A, LCSW 08/03/2014 

## 2014-08-09 ENCOUNTER — Ambulatory Visit (HOSPITAL_COMMUNITY): Payer: Self-pay | Admitting: Licensed Clinical Social Worker

## 2014-11-28 ENCOUNTER — Ambulatory Visit (INDEPENDENT_AMBULATORY_CARE_PROVIDER_SITE_OTHER): Payer: Medicare Other | Admitting: Psychiatry

## 2014-11-28 ENCOUNTER — Encounter (HOSPITAL_COMMUNITY): Payer: Self-pay | Admitting: Psychiatry

## 2014-11-28 VITALS — BP 121/79 | HR 72 | Ht 75.0 in | Wt 250.0 lb

## 2014-11-28 DIAGNOSIS — F6381 Intermittent explosive disorder: Secondary | ICD-10-CM

## 2014-11-28 DIAGNOSIS — F3181 Bipolar II disorder: Secondary | ICD-10-CM

## 2014-11-28 DIAGNOSIS — F329 Major depressive disorder, single episode, unspecified: Secondary | ICD-10-CM | POA: Diagnosis not present

## 2014-11-28 DIAGNOSIS — F39 Unspecified mood [affective] disorder: Secondary | ICD-10-CM

## 2014-11-28 LAB — BASIC METABOLIC PANEL
BUN: 16 mg/dL (ref 6–23)
CALCIUM: 9.6 mg/dL (ref 8.4–10.5)
CO2: 25 mEq/L (ref 19–32)
CREATININE: 1.03 mg/dL (ref 0.50–1.35)
Chloride: 105 mEq/L (ref 96–112)
Glucose, Bld: 78 mg/dL (ref 70–99)
Potassium: 4.3 mEq/L (ref 3.5–5.3)
Sodium: 142 mEq/L (ref 135–145)

## 2014-11-28 LAB — TSH: TSH: 2.222 u[IU]/mL (ref 0.350–4.500)

## 2014-11-28 MED ORDER — FLUOXETINE HCL 20 MG PO CAPS: 20.0000 mg | ORAL_CAPSULE | Freq: Every day | ORAL | Status: DC

## 2014-11-28 MED ORDER — ARIPIPRAZOLE 30 MG PO TABS: 30.0000 mg | ORAL_TABLET | Freq: Every day | ORAL | Status: DC

## 2014-11-28 NOTE — Progress Notes (Signed)
Patient ID: Tony [name], male   DOB: 05-17-1971, 44 y.o.   MRN: [id number] [facility] Follow-up Outpatient Visit  ARTIS BEGGS [id number] 44 y.o.  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 Intermittent explosive disorder  Patient is here with his wife and time spent in getting collateral information.  Some improvement in mood since on abilify at .  Less arguments at home. He gets some work during the day that helps.  Now working which has helped. No significant anger outburst. Has a daughter 3years of age also now diagnosed with Intermittent explosive. Family was stressed of her behaviour   There is no involuntary movements. AI MS test is 0  Depression improved now severity as 7/10. He is trying to keep a so busy  There's no hopelessness or helplessness. No manic symptoms or psychotic symptoms Does not endorse hallucinations or manic symptoms he remains tired and withdrawn. He motivated because of not having a job but also does not do much during the daytime and excuses out of some household chores  Duration of depression 2-4 years. Severity 6/10.  Aggravating factors include back condition and immobility he hurt his back while he was hunting deer hunt he built fell down on him. He also recently got a ticket for not having his seatbelt.  Modifying factors. Supportive wife and daughters.    No aggression recently. 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  Past Medical History  Diagnosis Date  . Sleep apnea     no cpcp yet  . H/O hiatal hernia   . Blood dyscrasia   . DDD (degenerative disc disease)   . GERD (gastroesophageal reflux disease)   . Depression    Family History   Problem Relation Age of Onset  . Cancer - Other Maternal Grandmother   . Dementia Paternal Grandfather   . Cancer - Other Paternal Grandfather   . Anxiety disorder Neg Hx   . Depression Neg Hx   . Bipolar disorder Neg Hx     Outpatient Encounter Prescriptions as of 11/28/2014  Medication Sig  . ARIPiprazole (ABILIFY) 30 MG tablet Take 1 tablet (30 mg total) by mouth daily.  Marland Kitchen FLUoxetine (PROZAC) 20 MG capsule Take 1 capsule (20 mg total) by mouth daily.  . pantoprazole (PROTONIX) 40 MG tablet Take 1 tablet (40 mg total) by mouth daily.  . [DISCONTINUED] ARIPiprazole (ABILIFY) 30 MG tablet Take 1 tablet (30 mg total) by mouth daily.  . [DISCONTINUED] FLUoxetine (PROZAC) 20 MG capsule Take 1 capsule (20 mg total) by mouth daily.   No facility-administered encounter medications on file as of 11/28/2014.    No results found for this or any previous visit (from the past 2160 hour(s)).  BP 121/79 mmHg  Pulse 72  Ht  (1.905 m)  Wt 250 lb (113.399 kg)  BMI 31.25 kg/m2   Review of Systems  Constitutional: Negative.   Skin: Negative for rash.  Neurological: Negative for tremors.  Psychiatric/Behavioral: Negative for depression and hallucinations.    Mental Status Examination  Appearance: casual Alert: Yes Attention: fair  Cooperative: Yes Eye Contact: Fair Speech: coherent. Normal tone Psychomotor Activity: Decreased Memory/Concentration: fair Oriented: person, place, time/date and situation Mood:  more reactive and less dysphoric Affect: Congruent Thought Processes and Associations: Linear Fund of Knowledge: Fair Thought Content: Suicidal ideation and Homicidal ideation were denied Insight: Fair Judgement: Fair  Diagnosis: Depressive disorder possible bipolar disorder type II current episode depressed. Intermittent explosive disorder per history  Treatment Plan:   Continue abilify for bipolar and intermittent explosive disorder.  Prozac for mood symptoms and  depression.  No guns or access to guns at home. He agrees.  Not been regular in therapy. Says he is working and that keeps him busy. Medical complexity: labs for prolactin, lipid profile , Tsh written  Pertinent Labs and Relevant Prior Notes reviewed. Medication Side effects, benefits and risks reviewed/discussed with Patient. Time given for patient to respond and asks questions regarding the Diagnosis and Medications. Safety concerns and to report to ER if suicidal or call 911. Relevant Medications refilled or called in to pharmacy. Discussed weight maintenance and Sleep Hygiene. Follow up with Primary care provider in regards to Medical conditions. Recommend compliance with medications and follow up office appointments. Discussed to avail opportunity to consider or/and continue Individual therapy with Counselor. Greater than 50% of time was spend in counseling and coordination of care with the patient.  Schedule for Follow up visit in 8 weeks or call in earlier as necessary.  Time spent: 25 minutes  Thresa RossAKHTAR, Zaylie Gisler, MD 11/28/2014

## 2014-11-29 LAB — PROLACTIN: PROLACTIN: 1.7 ng/mL — AB (ref 2.1–17.1)

## 2014-12-06 IMAGING — CT CT CERVICAL SPINE W/ CM
4 of 6 series · 12 of 29 positions shown, 13 images · IV contrast (omnipaque)
Comparison: MRI 08/25/2011.  Plain films 05/12/2012.

MYELOGRAM  INJECTION
TECHNIQUE: Informed consent was obtained from the patient prior to
the procedure, including potential complications of headache,
allergy, infection and pain. Specific instructions were given
regarding 24 hour bedrest post procedure to prevent post-LP
headache.  A timeout procedure was performed.  With the patient
prone, the lower back was prepped with Betadine.  1% Lidocaine was
used for local anesthesia.  Lumbar puncture was performed by the
radiologist at the L5-6Ylevel using a 22 gauge needle with return
of clear CSF. I personally performed the lumbar puncture and
administered the intrathecal contrast. I also personally supervised
acquisition of the myelogram images. 10cc of Omnipaque 900was
injected into the subarachnoid space .
CLINICAL DATA: Neck pain.  Bilateral arm pain worse on the left.
TECHNIQUE: Multidetector CT imaging of the cervical spine was
performed following myelography.  Multiplanar CT image
reconstructions were also generated.

[Series 2: c spine bone · axial · 0.27mm/px · z∈[-166,-76]mm · 3 of 74 slices shown, 4 images]
[im 19/74  soft-tissue]
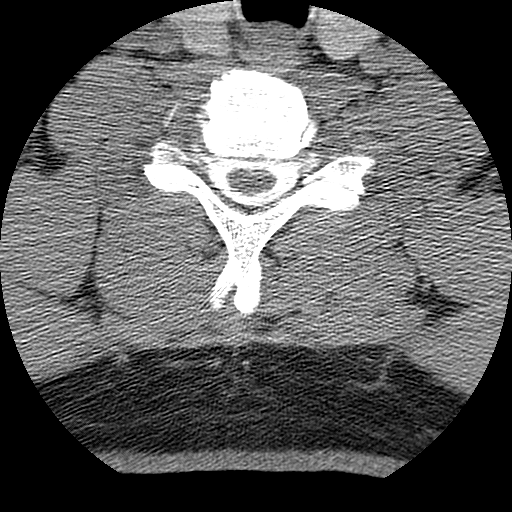
[im 19/74  bone]
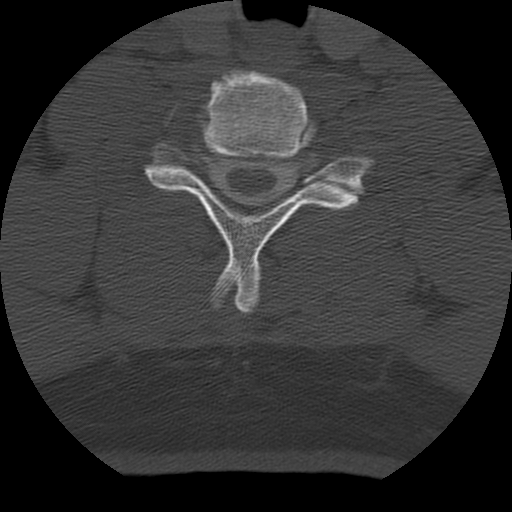
[im 37/74  bone]
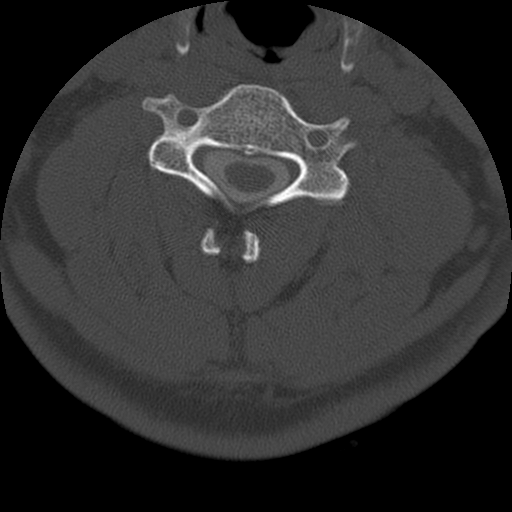
[im 55/74  bone]
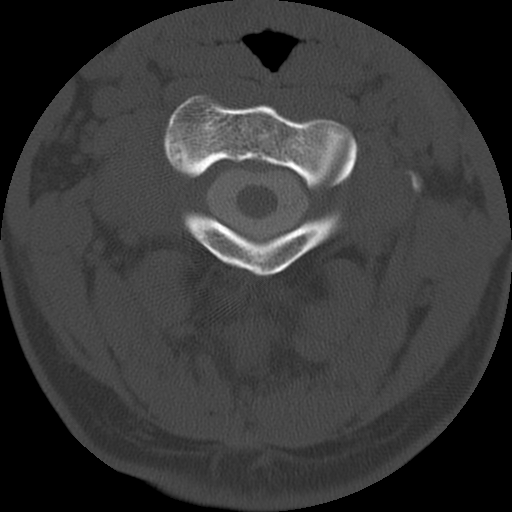

[Series 3: c spine soft · axial · 0.27mm/px · z∈[-151,-91]mm · 2 of 74 slices shown]
[im 25/74  soft-tissue]
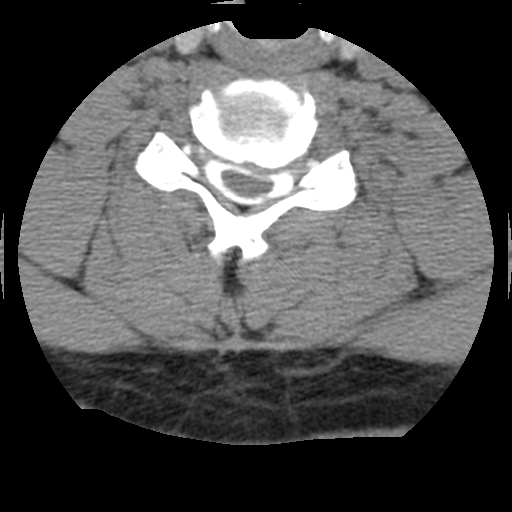
[im 49/74  soft-tissue]
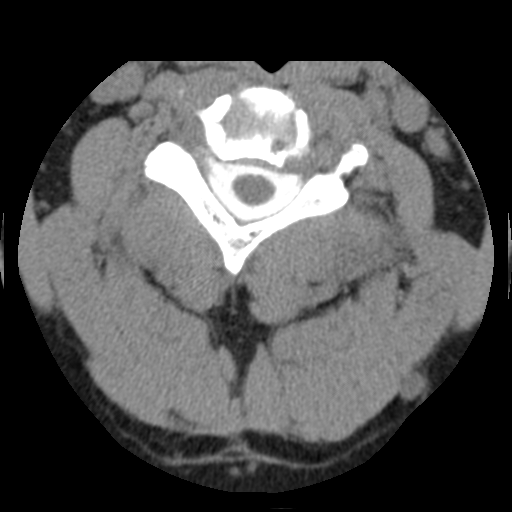

[Series 103: axial · axial · 0.23mm/px · z∈[-162,-71]mm · 3 of 92 slices shown]
[im 23/92  bone]
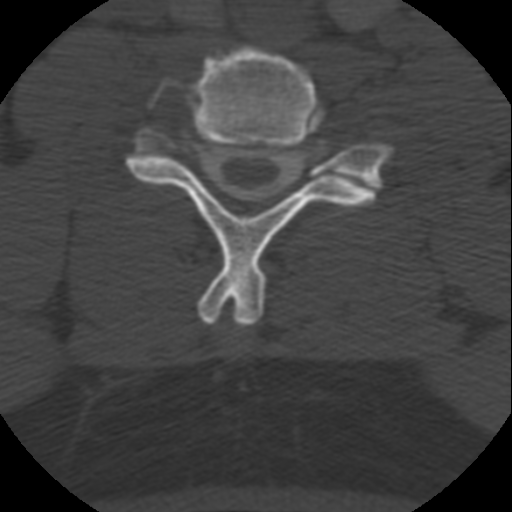
[im 46/92  bone]
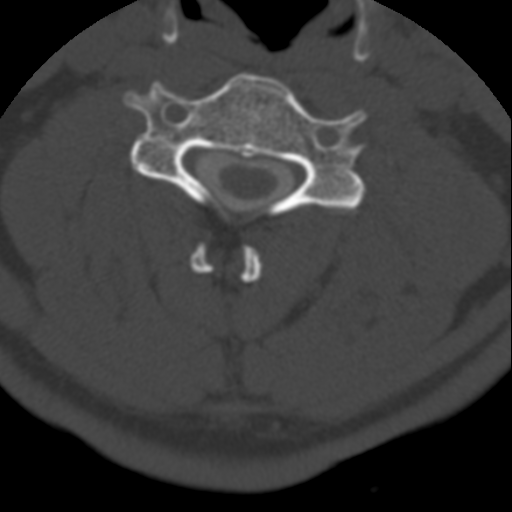
[im 69/92  bone]
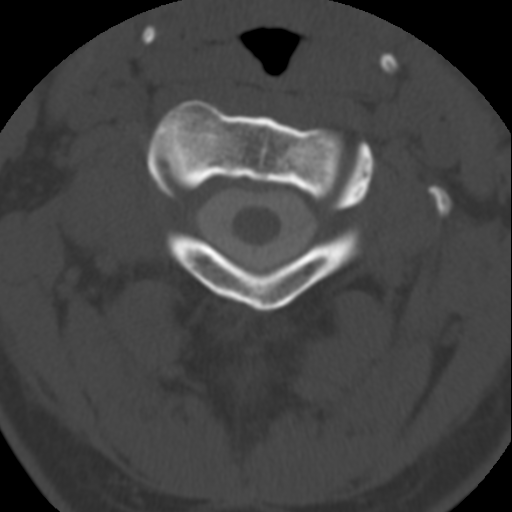

[Series 401: cor lower · coronal · 0.37mm/px · 4 of 42 slices shown]
[im 2/42  soft-tissue]
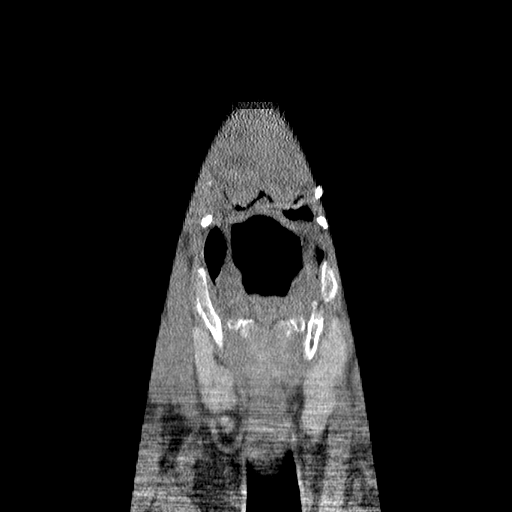
[im 11/42  bone]
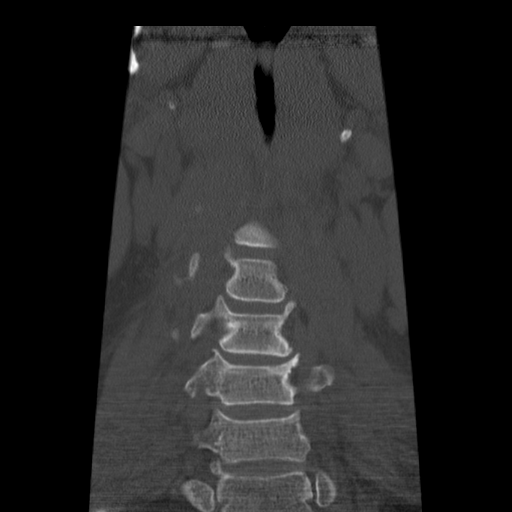
[im 21/42  bone]
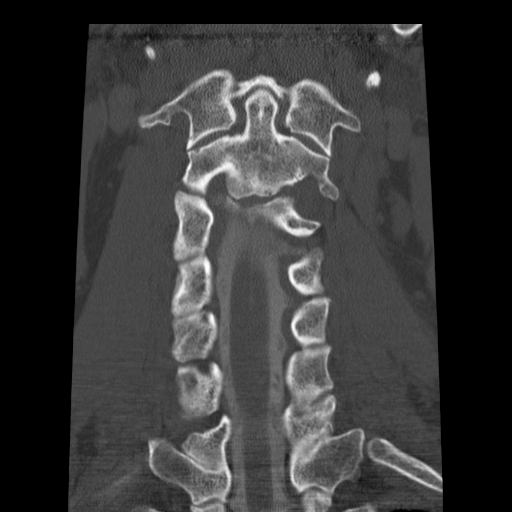
[im 31/42  bone]
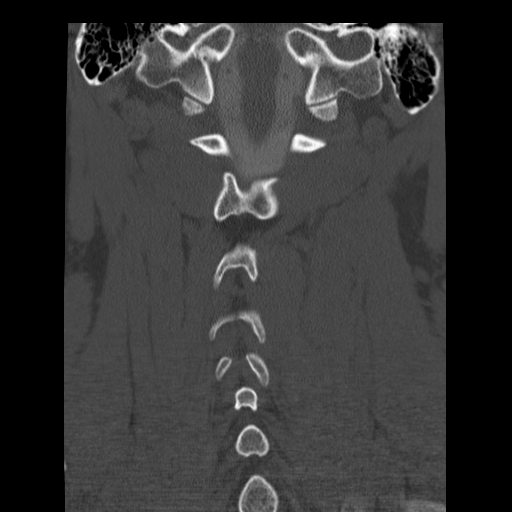

[12 of 29 positions shown; findings below may reference images not displayed]

IMPRESSION: Successful injection of  intrathecal contrast for myelography.

MYELOGRAM CERVICAL
FINDINGS: Good opacification of the cervical subarachnoid space.
Disc space narrowing C5-6 with mild straightening. Slight
truncation left C6 nerve root on oblique views.  Shallow ventral
defects at both C4-5 and C5-6.  Flexion/extension show no abnormal
movement.

Fluoroscopy Time: 2.53 minutes
IMPRESSION: As above.

CT MYELOGRAPHY CERVICAL SPINE
The individual disc spaces were examined as follows:

C2-3:  Normal.

C3-4:  Normal.

C4-5:   Central protrusion.  No C5 nerve root encroachment.

C5-6:  Central and leftward protrusion with osteophyte formation.
Mild left-sided cord flattening.  Left C6 nerve root encroachment.

C6-7:   Normal.

C7-T1:  Normal disc space.  Mild facet arthropathy.

Compared with prior MR, a similar appearance is suggested.
IMPRESSION: Central and leftward protrusion associated bony overgrowth at C5-6.
Mild left-sided cord flattening and left C6 nerve root
encroachment.

Central protrusion at C4-5 without C5 nerve root encroachment.

No dynamic instability.

## 2014-12-06 IMAGING — CR DG MYELOGRAM CERVICAL
14 of 16 series · 14 of 16 positions shown · IV contrast (omnipaque)
Comparison: MRI 08/25/2011.  Plain films 05/12/2012.

MYELOGRAM  INJECTION
TECHNIQUE: Informed consent was obtained from the patient prior to
the procedure, including potential complications of headache,
allergy, infection and pain. Specific instructions were given
regarding 24 hour bedrest post procedure to prevent post-LP
headache.  A timeout procedure was performed.  With the patient
prone, the lower back was prepped with Betadine.  1% Lidocaine was
used for local anesthesia.  Lumbar puncture was performed by the
radiologist at the L5-6Ylevel using a 22 gauge needle with return
of clear CSF. I personally performed the lumbar puncture and
administered the intrathecal contrast. I also personally supervised
acquisition of the myelogram images. 10cc of Omnipaque 900was
injected into the subarachnoid space .
CLINICAL DATA: Neck pain.  Bilateral arm pain worse on the left.
TECHNIQUE: Multidetector CT imaging of the cervical spine was
performed following myelography.  Multiplanar CT image
reconstructions were also generated.

[[hospital] (1 of 3)]
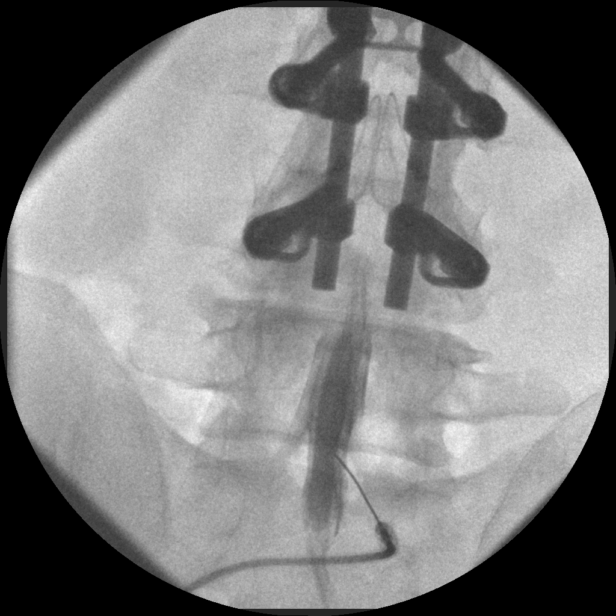

[[hospital] (2 of 3)]
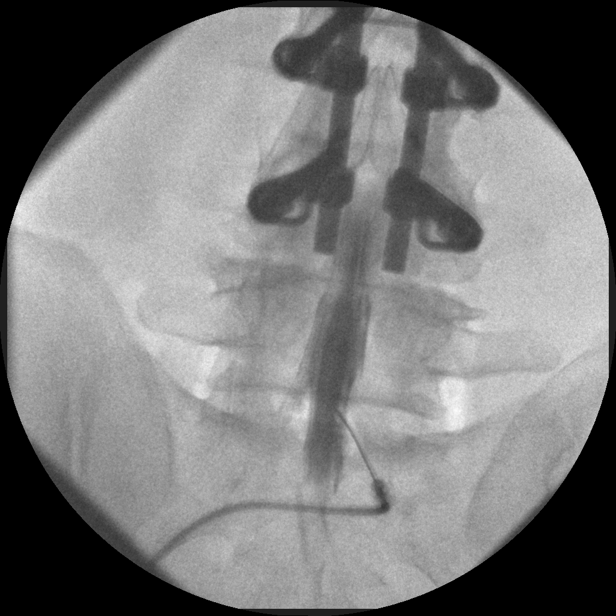

[myelogram  white (1 of 8)]
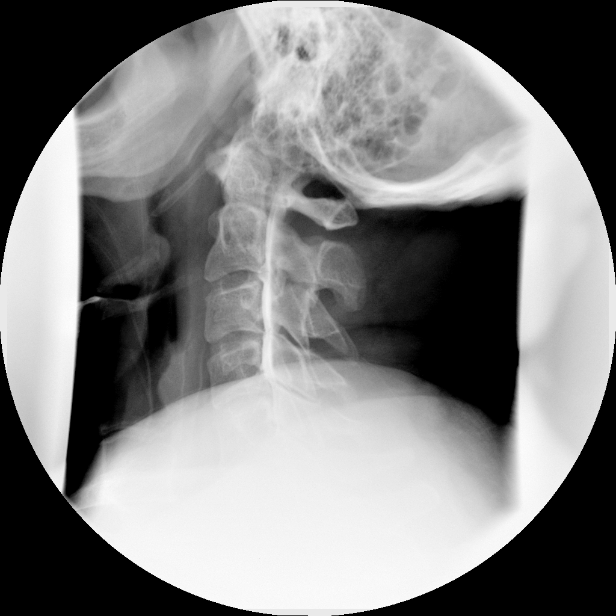

[myelogram  white (2 of 8)]
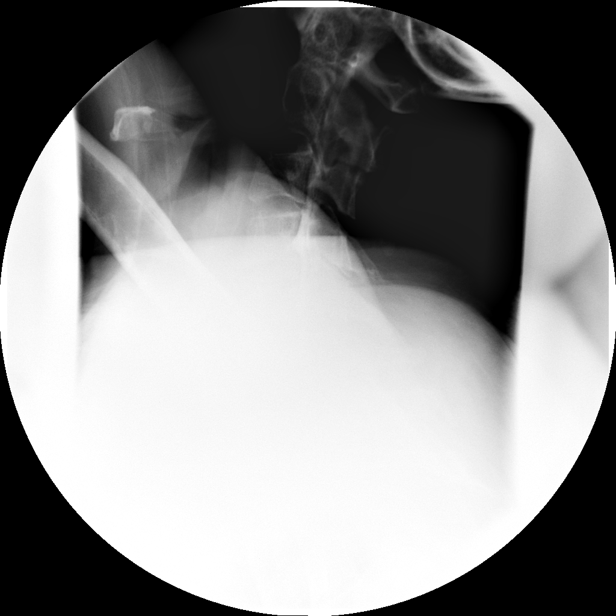

[[hospital] (3 of 3)]
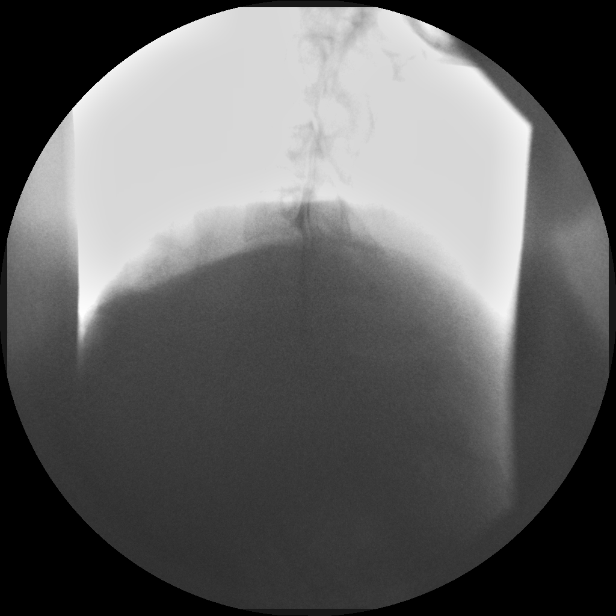

[myelogram  white (3 of 8)]
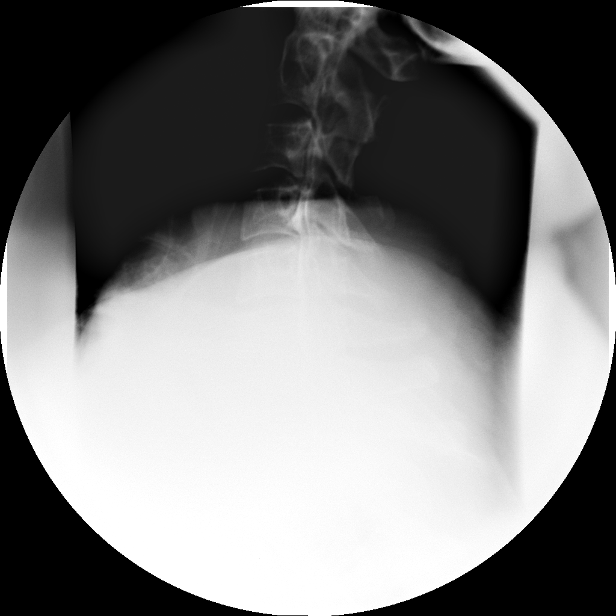

[myelogram  white (4 of 8)]
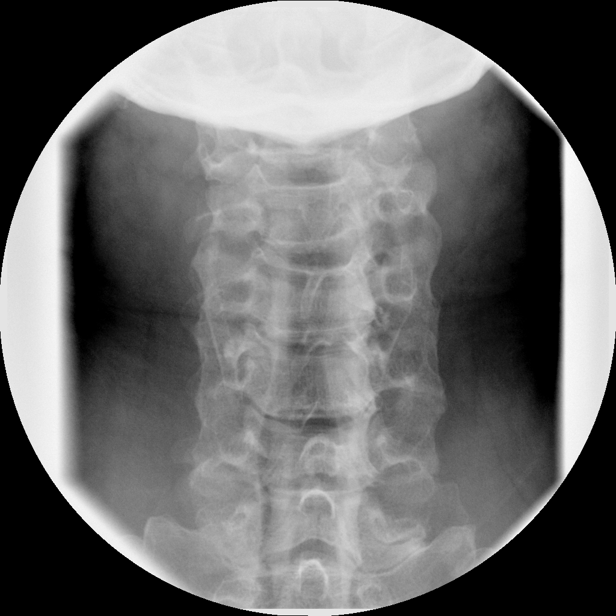

[myelogram  white (5 of 8)]
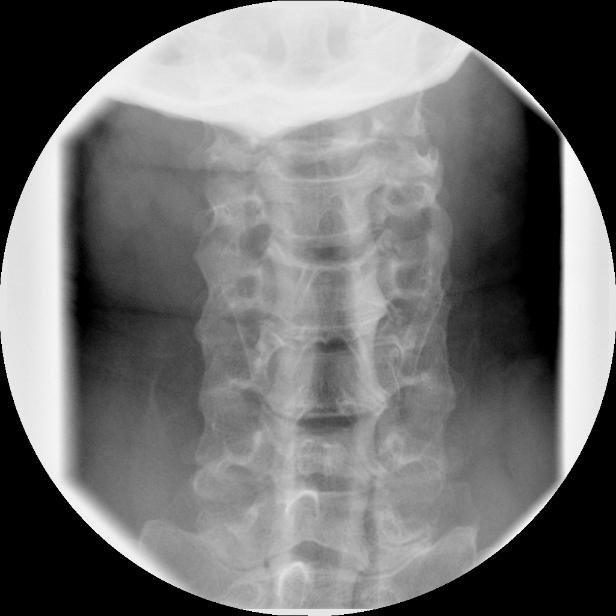

[myelogram  white (6 of 8)]
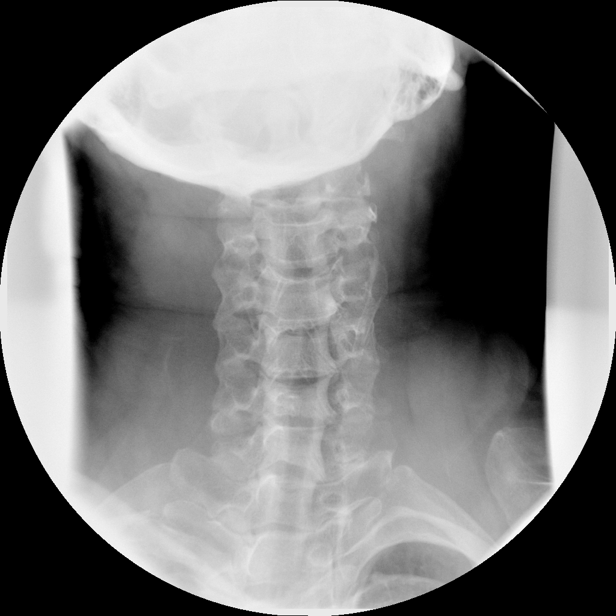

[myelogram  white (7 of 8)]
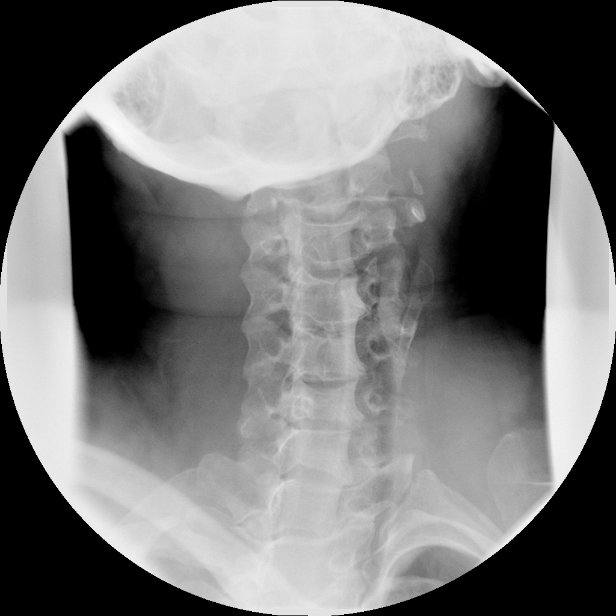

[myelogram  white (8 of 8)]
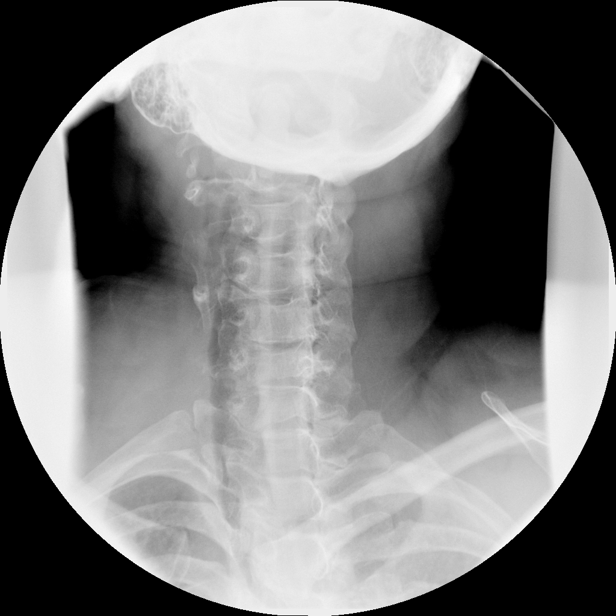

[view not recorded (1 of 3)]
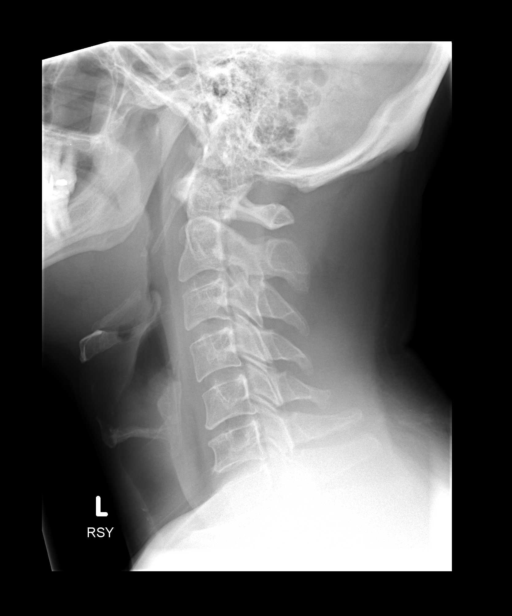

[view not recorded (2 of 3)]
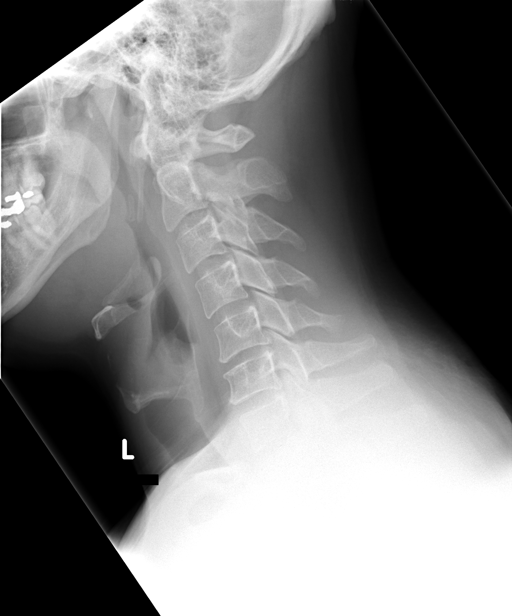

[view not recorded (3 of 3)]
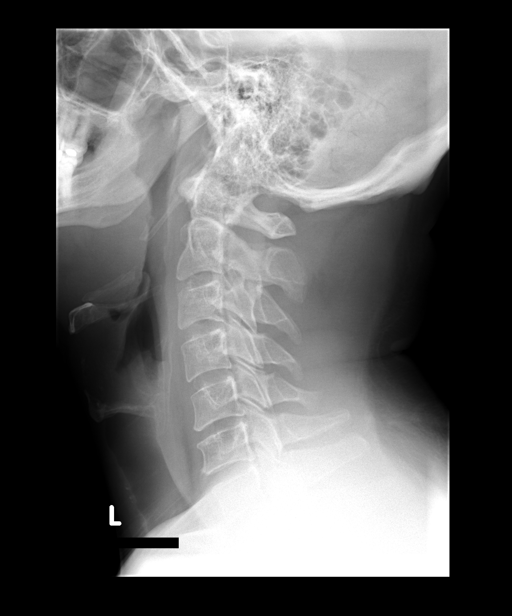

[14 of 16 positions shown; findings below may reference images not displayed]

IMPRESSION: Successful injection of  intrathecal contrast for myelography.

MYELOGRAM CERVICAL
FINDINGS: Good opacification of the cervical subarachnoid space.
Disc space narrowing C5-6 with mild straightening. Slight
truncation left C6 nerve root on oblique views.  Shallow ventral
defects at both C4-5 and C5-6.  Flexion/extension show no abnormal
movement.

Fluoroscopy Time: 2.53 minutes
IMPRESSION: As above.

CT MYELOGRAPHY CERVICAL SPINE
The individual disc spaces were examined as follows:

C2-3:  Normal.

C3-4:  Normal.

C4-5:   Central protrusion.  No C5 nerve root encroachment.

C5-6:  Central and leftward protrusion with osteophyte formation.
Mild left-sided cord flattening.  Left C6 nerve root encroachment.

C6-7:   Normal.

C7-T1:  Normal disc space.  Mild facet arthropathy.

Compared with prior MR, a similar appearance is suggested.
IMPRESSION: Central and leftward protrusion associated bony overgrowth at C5-6.
Mild left-sided cord flattening and left C6 nerve root
encroachment.

Central protrusion at C4-5 without C5 nerve root encroachment.

No dynamic instability.

## 2015-02-28 ENCOUNTER — Ambulatory Visit (INDEPENDENT_AMBULATORY_CARE_PROVIDER_SITE_OTHER): Payer: Medicare Other | Admitting: Psychiatry

## 2015-02-28 ENCOUNTER — Encounter (HOSPITAL_COMMUNITY): Payer: Self-pay | Admitting: Psychiatry

## 2015-02-28 DIAGNOSIS — F329 Major depressive disorder, single episode, unspecified: Secondary | ICD-10-CM

## 2015-02-28 DIAGNOSIS — F39 Unspecified mood [affective] disorder: Secondary | ICD-10-CM

## 2015-02-28 DIAGNOSIS — F6381 Intermittent explosive disorder: Secondary | ICD-10-CM | POA: Diagnosis not present

## 2015-02-28 DIAGNOSIS — F3181 Bipolar II disorder: Secondary | ICD-10-CM

## 2015-02-28 MED ORDER — FLUOXETINE HCL 20 MG PO CAPS: 20.0000 mg | ORAL_CAPSULE | Freq: Every day | ORAL | Status: DC

## 2015-02-28 MED ORDER — ARIPIPRAZOLE 30 MG PO TABS: 30.0000 mg | ORAL_TABLET | Freq: Every day | ORAL | Status: DC

## 2015-02-28 NOTE — Progress Notes (Signed)
Patient ID: Tony [name], male   DOB: Mar 02, 1971, 44 y.o.   MRN: [id number] [facility] Follow-up Outpatient Visit  Tony [name] [id number] 44 y.o.  02/28/2015  Chief Complaint: Depression     History of Present Illness:   Patient returns for Medication Follow up and is diagnosed with depressive disorder possible bipolar disorder type II. Intermittent explosive disorder  Wife not here. Patient is working as a Database administrator truck now nearly full-time this has improved his mood since he is busy and not focused on depression or withdrawn behavior.  Some improvement in mood since on abilify at .  Less arguments at home. He gets some work during the day that helps.  Now working which has helped. No significant anger outburst. Has a daughter 45years of age also now diagnosed with Intermittent explosive. She is also doing better.    There is no involuntary movements. AI MS test is 0 Labs reviewed. TSH normal. Somewhat low prolactin. Advised to make an appointment primary care doctor to consider getting a testosterone level considering his decreased libido. He has decreased libido may be related with the Prozac or Abilify. He does not want to change the doses since he is doing better Depression improved now severity as 7/10. He is trying to keep a so busy  There's no hopelessness or helplessness. No manic symptoms or psychotic symptoms Does not endorse hallucinations or manic symptoms he remains tired and withdrawn. He motivated because of not having a job but also does not do much during the daytime and excuses out of some household chores  Duration of depression 2-4 years. Severity 6/10.  Aggravating factors include back condition and immobility he hurt his back while he was hunting deer hunt he built fell down on him. He also recently got a ticket for not having his seatbelt.  Modifying factors. Supportive wife and daughters.    No aggression recently. 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   Past Medical History  Diagnosis Date  . Sleep apnea     no cpcp yet  . H/O hiatal hernia   . Blood dyscrasia   . DDD (degenerative disc disease)   . GERD (gastroesophageal reflux disease)   . Depression    Family History  Problem Relation Age of Onset  . Cancer - Other Maternal Grandmother   . Dementia Paternal Grandfather   . Cancer - Other Paternal Grandfather   . Anxiety disorder Neg Hx   . Depression Neg Hx   . Bipolar disorder Neg Hx     Outpatient Encounter Prescriptions as of 02/28/2015  Medication Sig  . ARIPiprazole (ABILIFY) 30 MG tablet Take 1 tablet (30 mg total) by mouth daily.  Marland Kitchen FLUoxetine (PROZAC) 20 MG capsule Take 1 capsule (20 mg total) by mouth daily.  . pantoprazole (PROTONIX) 40 MG tablet Take 1 tablet (40 mg total) by mouth daily.  . [DISCONTINUED] ARIPiprazole (ABILIFY) 30 MG tablet Take 1 tablet (30 mg total) by mouth daily.  . [DISCONTINUED] FLUoxetine (PROZAC) 20 MG capsule Take 1 capsule (20 mg total) by mouth daily.   No facility-administered encounter medications on file as of 02/28/2015.    No results found for this or any previous visit (from the past 2160 hour(s)).  There were no vitals taken for  this visit.   Review of Systems  Constitutional: Negative.   Respiratory: Negative for cough.   Cardiovascular: Negative for chest pain.  Skin: Negative for rash.  Neurological: Negative for tremors.  Psychiatric/Behavioral: Negative for depression and hallucinations.    Mental Status Examination  Appearance: casual Alert: Yes Attention: fair  Cooperative: Yes Eye Contact: Fair Speech: coherent. Normal tone Psychomotor Activity: Decreased Memory/Concentration: fair Oriented: person, place, time/date and situation Mood: more reactive and  less dysphoric Affect: Congruent Thought Processes and Associations: Linear Fund of Knowledge: Fair Thought Content: Suicidal ideation and Homicidal ideation were denied Insight: Fair Judgement: Fair  Diagnosis: Depressive disorder possible bipolar disorder type II current episode depressed. Intermittent explosive disorder per history  Treatment Plan:   Continue abilify for bipolar and intermittent explosive disorder.  Prozac for mood symptoms and depression.  No guns or access to guns at home. He agrees.  Not been regular in therapy. Says he is working and that keeps him busy. Medical complexity: labs reviewed. TSH is normal.  Patient to make appointment with her primary care in regarding to getting a testosterone level. Patient does have decreased libido but is not wanting to cut down the dose of Abilify or Prozac  Pertinent Labs and Relevant Prior Notes reviewed. Medication Side effects, benefits and risks reviewed/discussed with Patient. Time given for patient to respond and asks questions regarding the Diagnosis and Medications. Safety concerns and to report to ER if suicidal or call 911. Relevant Medications refilled or called in to pharmacy. Discussed weight maintenance and Sleep Hygiene. Follow up with Primary care provider in regards to Medical conditions. Recommend compliance with medications and follow up office appointments. Discussed to avail opportunity to consider or/and continue Individual therapy with Counselor. Greater than 50% of time was spend in counseling and coordination of care with the patient.  Schedule for Follow up visit in 8 weeks or call in earlier as necessary.  Time spent: 25 minutes  Thresa Ross, MD 02/28/2015

## 2015-05-30 ENCOUNTER — Encounter (HOSPITAL_COMMUNITY): Payer: Self-pay | Admitting: Psychiatry

## 2015-05-30 ENCOUNTER — Ambulatory Visit (INDEPENDENT_AMBULATORY_CARE_PROVIDER_SITE_OTHER): Payer: Medicare Other | Admitting: Psychiatry

## 2015-05-30 VITALS — BP 123/84 | HR 84 | Wt 260.0 lb

## 2015-05-30 DIAGNOSIS — F3181 Bipolar II disorder: Secondary | ICD-10-CM | POA: Diagnosis not present

## 2015-05-30 DIAGNOSIS — F6381 Intermittent explosive disorder: Secondary | ICD-10-CM

## 2015-05-30 DIAGNOSIS — F39 Unspecified mood [affective] disorder: Secondary | ICD-10-CM | POA: Diagnosis not present

## 2015-05-30 MED ORDER — ARIPIPRAZOLE 30 MG PO TABS: 30.0000 mg | ORAL_TABLET | Freq: Every day | ORAL | Status: DC

## 2015-05-30 MED ORDER — FLUOXETINE HCL 20 MG PO CAPS: 20.0000 mg | ORAL_CAPSULE | Freq: Every day | ORAL | Status: DC

## 2015-05-30 NOTE — Progress Notes (Signed)
Patient ID: Tony [name], male   DOB: 08-12-1970, 44 y.o.   MRN: [id number] [facility] Visit  Tony [name] [id number] 44 y.o.  05/30/2015  Chief Complaint: Depression     History of Present Illness:   Patient returns for Medication Follow up and is diagnosed with depressive disorder possible bipolar disorder type II. Intermittent explosive disorder  Patient has been doing reasonably well with the current medications. He is working as a Barrister's clerkdumpster driver. Wife not here today. Last year was difficult because of his anxiety depression and feeling a motivated but starting working and now with the medication response he has been gradually doing better.   There is no involuntary movements. AI MS test is 0 Labs reviewed. TSH normal. Somewhat low prolactin. Advised to make an appointment primary care doctor to consider getting a testosterone level considering his decreased libido. He has decreased libido may be related with the Prozac or Abilify. He does not want to change the doses since he is doing better Depression improved now severity as 7/10. He is trying to keep a so busy  There's no hopelessness or helplessness. No manic symptoms or psychotic symptoms Does not endorse hallucinations or manic symptoms he remains tired and withdrawn. He motivated because of not having a job but also does not do much during the daytime and excuses out of some household chores  Duration of depression 2-4 years. Severity 6/10.  Aggravating factors include back condition and immobility he hurt his back while he was hunting deer hunt he built fell down on him. He also recently got a ticket for not having his seatbelt.  Modifying factors. Supportive wife and daughters.    No aggression recently. 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   Past Medical History  Diagnosis Date  . Sleep apnea     no cpcp yet  . H/O hiatal hernia   . Blood dyscrasia   . DDD (degenerative disc disease)   . GERD (gastroesophageal reflux disease)   . Depression    Family History  Problem Relation Age of Onset  . Cancer - Other Maternal Grandmother   . Dementia Paternal Grandfather   . Cancer - Other Paternal Grandfather   . Anxiety disorder Neg Hx   . Depression Neg Hx   . Bipolar disorder Neg Hx     Outpatient Encounter Prescriptions as of 05/30/2015  Medication Sig  . ARIPiprazole (ABILIFY) 30 MG tablet Take 1 tablet (30 mg total) by mouth daily.  Marland Kitchen. FLUoxetine (PROZAC) 20 MG capsule Take 1 capsule (20 mg total) by mouth daily.  . pantoprazole (PROTONIX) 40 MG tablet Take 1 tablet (40 mg total) by mouth daily.  . [DISCONTINUED] ARIPiprazole (ABILIFY) 30 MG tablet Take 1 tablet (30 mg total) by mouth daily.  . [DISCONTINUED] FLUoxetine (PROZAC) 20 MG capsule Take 1 capsule (20 mg total) by mouth daily.   No facility-administered encounter medications on file as of 05/30/2015.    No results found for this or any previous visit (from the past 2160 hour(s)).  BP 123/84 mmHg  Pulse 84  Wt 260 lb (117.935 kg)   Review of Systems  Constitutional: Negative.   Respiratory: Negative for cough.   Cardiovascular: Negative for chest pain and palpitations.  Skin: Negative for rash.  Neurological:  Negative for tingling and tremors.  Psychiatric/Behavioral: Negative for depression and hallucinations.    Mental Status Examination  Appearance: casual Alert: Yes Attention: fair  Cooperative: Yes Eye Contact: Fair Speech: coherent. Normal tone Psychomotor Activity: Decreased Memory/Concentration: fair Oriented: person, place, time/date and situation Mood: more reactive and less dysphoric Affect: Congruent Thought Processes and Associations: Linear Fund of Knowledge: Fair Thought Content: Suicidal  ideation and Homicidal ideation were denied Insight: Fair Judgement: Fair  Diagnosis: Depressive disorder possible bipolar disorder type II current episode depressed. Intermittent explosive disorder per history  Treatment Plan:   Continue abilify for bipolar and intermittent explosive disorder.  Prozac for mood symptoms and depression. Prescriptions sent.   Medical complexity: labs reviewed. TSH is normal.  Patient to make appointment with her primary care in regarding to getting a testosterone level. Patient does have decreased libido but is not wanting to cut down the dose of Abilify or Prozac  Pertinent Labs and Relevant Prior Notes reviewed. Medication Side effects, benefits and risks reviewed/discussed with Patient. Time given for patient to respond and asks questions regarding the Diagnosis and Medications. Safety concerns and to report to ER if suicidal or call 911. Relevant Medications refilled or called in to pharmacy. Discussed weight maintenance and Sleep Hygiene. Follow up with Primary care provider in regards to Medical conditions. Recommend compliance with medications and follow up office appointments. Discussed to avail opportunity to consider or/and continue Individual therapy with Counselor. Greater than 50% of time was spend in counseling and coordination of care with the patient.  Schedule for Follow up visit in 8 weeks or call in earlier as necessary.  Time spent: 25 minutes  Thresa Ross, MD 05/30/2015

## 2015-08-28 ENCOUNTER — Ambulatory Visit (INDEPENDENT_AMBULATORY_CARE_PROVIDER_SITE_OTHER): Payer: Medicare Other | Admitting: Psychiatry

## 2015-08-28 ENCOUNTER — Encounter (HOSPITAL_COMMUNITY): Payer: Self-pay | Admitting: Psychiatry

## 2015-08-28 VITALS — BP 110/62 | HR 64 | Ht 75.0 in | Wt 254.0 lb

## 2015-08-28 DIAGNOSIS — F39 Unspecified mood [affective] disorder: Secondary | ICD-10-CM | POA: Diagnosis not present

## 2015-08-28 DIAGNOSIS — F3181 Bipolar II disorder: Secondary | ICD-10-CM | POA: Diagnosis not present

## 2015-08-28 DIAGNOSIS — F6381 Intermittent explosive disorder: Secondary | ICD-10-CM | POA: Diagnosis not present

## 2015-08-28 MED ORDER — ARIPIPRAZOLE 30 MG PO TABS: 30.0000 mg | ORAL_TABLET | Freq: Every day | ORAL | Status: DC

## 2015-08-28 MED ORDER — FLUOXETINE HCL 20 MG PO CAPS: 20.0000 mg | ORAL_CAPSULE | Freq: Every day | ORAL | Status: AC
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
## 2015-11-28 ENCOUNTER — Ambulatory Visit (HOSPITAL_COMMUNITY): Payer: Self-pay | Admitting: Psychiatry

## 2018-12-02 ENCOUNTER — Encounter (HOSPITAL_COMMUNITY): Payer: Self-pay | Admitting: Psychiatry

## 2018-12-02 ENCOUNTER — Ambulatory Visit (INDEPENDENT_AMBULATORY_CARE_PROVIDER_SITE_OTHER): Payer: Medicare HMO | Admitting: Psychiatry

## 2018-12-02 DIAGNOSIS — F0630 Mood disorder due to known physiological condition, unspecified: Secondary | ICD-10-CM

## 2018-12-02 DIAGNOSIS — F4310 Post-traumatic stress disorder, unspecified: Secondary | ICD-10-CM

## 2018-12-02 DIAGNOSIS — F39 Unspecified mood [affective] disorder: Secondary | ICD-10-CM | POA: Diagnosis not present

## 2018-12-02 DIAGNOSIS — F320 Major depressive disorder, single episode, mild: Secondary | ICD-10-CM
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
## 2018-12-12 ENCOUNTER — Other Ambulatory Visit: Payer: Self-pay

## 2018-12-12 ENCOUNTER — Ambulatory Visit (INDEPENDENT_AMBULATORY_CARE_PROVIDER_SITE_OTHER): Payer: Medicare HMO | Admitting: Licensed Clinical Social Worker

## 2018-12-12 DIAGNOSIS — F4310 Post-traumatic stress disorder, unspecified: Secondary | ICD-10-CM
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
## 2018-12-26 ENCOUNTER — Ambulatory Visit (INDEPENDENT_AMBULATORY_CARE_PROVIDER_SITE_OTHER): Payer: Medicare HMO | Admitting: Licensed Clinical Social Worker

## 2018-12-26 DIAGNOSIS — F4310 Post-traumatic stress disorder, unspecified: Secondary | ICD-10-CM | POA: Diagnosis not present

## 2018-12-27 ENCOUNTER — Encounter (HOSPITAL_COMMUNITY): Payer: Self-pay | Admitting: Licensed Clinical Social Worker
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
## 2019-01-05 ENCOUNTER — Ambulatory Visit (HOSPITAL_COMMUNITY): Payer: Medicare HMO | Admitting: Psychiatry

## 2019-01-16 ENCOUNTER — Ambulatory Visit (HOSPITAL_COMMUNITY): Payer: Medicare HMO | Admitting: Licensed Clinical Social Worker

## 2021-07-16 ENCOUNTER — Other Ambulatory Visit: Payer: Self-pay | Admitting: Specialist

## 2021-07-16 DIAGNOSIS — M546 Pain in thoracic spine: Secondary | ICD-10-CM

## 2021-07-21 ENCOUNTER — Other Ambulatory Visit: Payer: Self-pay | Admitting: Specialist

## 2021-07-21 DIAGNOSIS — M5451 Vertebrogenic low back pain: Secondary | ICD-10-CM

## 2021-07-23 ENCOUNTER — Other Ambulatory Visit: Payer: Self-pay

## 2021-07-23 ENCOUNTER — Other Ambulatory Visit: Payer: Medicare HMO

## 2021-07-23 ENCOUNTER — Ambulatory Visit
Admission: RE | Admit: 2021-07-23 | Discharge: 2021-07-23 | Disposition: A | Discharge status: Home / Self Care | Payer: Medicare HMO | Source: Ambulatory Visit | Attending: Specialist | Admitting: Specialist

## 2021-07-23 DIAGNOSIS — M546 Pain in thoracic spine: Secondary | ICD-10-CM

## 2021-07-23 DIAGNOSIS — M5451 Vertebrogenic low back pain: Secondary | ICD-10-CM
